# Patient Record
Sex: Female | Born: 1935 | Race: White | Hispanic: No | Marital: Married | Smoking: Current every day smoker
Health system: Southern US, Community
[De-identification: ages and names within clinical notes are randomized; demographics above are authoritative.]

## PROBLEM LIST (undated history)

## (undated) DIAGNOSIS — C329 Malignant neoplasm of larynx, unspecified: Secondary | ICD-10-CM

## (undated) DIAGNOSIS — S322XXA Fracture of coccyx, initial encounter for closed fracture: Secondary | ICD-10-CM

## (undated) DIAGNOSIS — E7800 Pure hypercholesterolemia, unspecified: Secondary | ICD-10-CM

## (undated) DIAGNOSIS — I700 Atherosclerosis of aorta: Secondary | ICD-10-CM

## (undated) DIAGNOSIS — I739 Peripheral vascular disease, unspecified: Secondary | ICD-10-CM

## (undated) DIAGNOSIS — I714 Abdominal aortic aneurysm, without rupture, unspecified: Secondary | ICD-10-CM

## (undated) DIAGNOSIS — J40 Bronchitis, not specified as acute or chronic: Secondary | ICD-10-CM

## (undated) DIAGNOSIS — J449 Chronic obstructive pulmonary disease, unspecified: Secondary | ICD-10-CM

## (undated) DIAGNOSIS — Z720 Tobacco use: Secondary | ICD-10-CM

## (undated) DIAGNOSIS — I10 Essential (primary) hypertension: Secondary | ICD-10-CM

## (undated) HISTORY — DX: Pure hypercholesterolemia, unspecified: E78.00

## (undated) HISTORY — DX: Abdominal aortic aneurysm, without rupture, unspecified: I71.40

## (undated) HISTORY — DX: Bronchitis, not specified as acute or chronic: J40

## (undated) HISTORY — DX: Essential (primary) hypertension: I10

## (undated) HISTORY — PX: BREAST SURGERY: SHX581

## (undated) HISTORY — DX: Atherosclerosis of aorta: I70.0

## (undated) HISTORY — DX: Chronic obstructive pulmonary disease, unspecified: J44.9

## (undated) HISTORY — PX: RENAL CRYOABLATION: SHX2322

## (undated) HISTORY — PX: NEPHRECTOMY: SHX65

## (undated) HISTORY — DX: Abdominal aortic aneurysm, without rupture: I71.4

## (undated) HISTORY — DX: Malignant neoplasm of larynx, unspecified: C32.9

## (undated) HISTORY — DX: Tobacco use: Z72.0

## (undated) HISTORY — DX: Peripheral vascular disease, unspecified: I73.9

## (undated) HISTORY — DX: Fracture of coccyx, initial encounter for closed fracture: S32.2XXA

## (undated) HISTORY — PX: COLONOSCOPY: SHX174

## (undated) HISTORY — PX: TONSILLECTOMY: SUR1361

---

## 1998-04-16 ENCOUNTER — Ambulatory Visit (HOSPITAL_COMMUNITY): Admission: RE | Admit: 1998-04-16 | Discharge: 1998-04-16 | Payer: Self-pay | Admitting: Urology

## 1998-07-22 ENCOUNTER — Other Ambulatory Visit: Admission: RE | Admit: 1998-07-22 | Discharge: 1998-07-22 | Payer: Self-pay | Admitting: Cardiology

## 1998-10-06 ENCOUNTER — Encounter: Payer: Self-pay | Admitting: Otolaryngology

## 1998-10-06 ENCOUNTER — Ambulatory Visit (HOSPITAL_COMMUNITY): Admission: RE | Admit: 1998-10-06 | Discharge: 1998-10-06 | Payer: Self-pay

## 1999-10-09 ENCOUNTER — Encounter: Payer: Self-pay | Admitting: Cardiology

## 1999-10-09 ENCOUNTER — Ambulatory Visit (HOSPITAL_COMMUNITY): Admission: RE | Admit: 1999-10-09 | Discharge: 1999-10-09 | Payer: Self-pay | Admitting: Cardiology

## 2000-08-31 ENCOUNTER — Other Ambulatory Visit: Admission: RE | Admit: 2000-08-31 | Discharge: 2000-08-31 | Payer: Self-pay | Admitting: Internal Medicine

## 2000-10-25 ENCOUNTER — Ambulatory Visit (HOSPITAL_COMMUNITY): Admission: RE | Admit: 2000-10-25 | Discharge: 2000-10-25 | Payer: Self-pay | Admitting: Otolaryngology

## 2000-10-25 ENCOUNTER — Encounter: Payer: Self-pay | Admitting: Otolaryngology

## 2001-03-02 ENCOUNTER — Encounter: Admission: RE | Admit: 2001-03-02 | Discharge: 2001-03-02 | Payer: Self-pay | Admitting: Urology

## 2001-03-02 ENCOUNTER — Encounter: Payer: Self-pay | Admitting: Urology

## 2001-10-27 ENCOUNTER — Encounter: Payer: Self-pay | Admitting: Otolaryngology

## 2001-10-27 ENCOUNTER — Ambulatory Visit (HOSPITAL_COMMUNITY): Admission: RE | Admit: 2001-10-27 | Discharge: 2001-10-27 | Payer: Self-pay | Admitting: Otolaryngology

## 2002-03-09 ENCOUNTER — Encounter: Admission: RE | Admit: 2002-03-09 | Discharge: 2002-03-09 | Payer: Self-pay | Admitting: Urology

## 2002-03-09 ENCOUNTER — Encounter: Payer: Self-pay | Admitting: Urology

## 2002-10-03 ENCOUNTER — Ambulatory Visit (HOSPITAL_COMMUNITY): Admission: RE | Admit: 2002-10-03 | Discharge: 2002-10-03 | Payer: Self-pay | Admitting: Gastroenterology

## 2003-04-02 ENCOUNTER — Encounter (HOSPITAL_BASED_OUTPATIENT_CLINIC_OR_DEPARTMENT_OTHER): Admission: RE | Admit: 2003-04-02 | Discharge: 2003-07-01 | Payer: Self-pay | Admitting: Internal Medicine

## 2003-06-28 ENCOUNTER — Ambulatory Visit (HOSPITAL_COMMUNITY): Admission: RE | Admit: 2003-06-28 | Discharge: 2003-06-28 | Payer: Self-pay | Admitting: Urology

## 2003-06-28 ENCOUNTER — Encounter: Payer: Self-pay | Admitting: Urology

## 2004-10-29 ENCOUNTER — Ambulatory Visit (HOSPITAL_COMMUNITY): Admission: RE | Admit: 2004-10-29 | Discharge: 2004-10-29 | Payer: Self-pay | Admitting: Internal Medicine

## 2005-03-19 ENCOUNTER — Inpatient Hospital Stay (HOSPITAL_COMMUNITY): Admission: EM | Admit: 2005-03-19 | Discharge: 2005-03-21 | Payer: Self-pay | Admitting: *Deleted

## 2005-04-19 ENCOUNTER — Ambulatory Visit (HOSPITAL_COMMUNITY): Admission: RE | Admit: 2005-04-19 | Discharge: 2005-04-19 | Payer: Self-pay | Admitting: Internal Medicine

## 2005-10-05 ENCOUNTER — Ambulatory Visit: Admission: RE | Admit: 2005-10-05 | Discharge: 2005-10-05 | Payer: Self-pay | Admitting: Urology

## 2005-11-17 ENCOUNTER — Ambulatory Visit (HOSPITAL_COMMUNITY): Admission: RE | Admit: 2005-11-17 | Discharge: 2005-11-17 | Payer: Self-pay | Admitting: Internal Medicine

## 2006-08-25 ENCOUNTER — Ambulatory Visit (HOSPITAL_COMMUNITY): Admission: RE | Admit: 2006-08-25 | Discharge: 2006-08-25 | Payer: Self-pay | Admitting: Urology

## 2006-11-23 ENCOUNTER — Ambulatory Visit (HOSPITAL_COMMUNITY): Admission: RE | Admit: 2006-11-23 | Discharge: 2006-11-23 | Payer: Self-pay | Admitting: Internal Medicine

## 2007-01-05 ENCOUNTER — Encounter: Admission: RE | Admit: 2007-01-05 | Discharge: 2007-01-05 | Payer: Self-pay | Admitting: Otolaryngology

## 2007-09-26 ENCOUNTER — Ambulatory Visit (HOSPITAL_COMMUNITY): Admission: RE | Admit: 2007-09-26 | Discharge: 2007-09-26 | Payer: Self-pay | Admitting: Urology

## 2007-12-14 HISTORY — PX: HIP FRACTURE SURGERY: SHX118

## 2007-12-19 ENCOUNTER — Encounter: Admission: RE | Admit: 2007-12-19 | Discharge: 2007-12-19 | Payer: Self-pay | Admitting: Otolaryngology

## 2008-04-07 ENCOUNTER — Inpatient Hospital Stay (HOSPITAL_COMMUNITY): Admission: EM | Admit: 2008-04-07 | Discharge: 2008-04-12 | Payer: Self-pay | Admitting: Emergency Medicine

## 2008-07-16 ENCOUNTER — Ambulatory Visit (HOSPITAL_COMMUNITY): Admission: RE | Admit: 2008-07-16 | Discharge: 2008-07-16 | Payer: Self-pay | Admitting: Internal Medicine

## 2008-09-25 ENCOUNTER — Ambulatory Visit (HOSPITAL_COMMUNITY): Admission: RE | Admit: 2008-09-25 | Discharge: 2008-09-25 | Payer: Self-pay | Admitting: Urology

## 2008-10-09 ENCOUNTER — Encounter: Admission: RE | Admit: 2008-10-09 | Discharge: 2008-10-09 | Payer: Self-pay | Admitting: Urology

## 2008-10-16 ENCOUNTER — Encounter: Admission: RE | Admit: 2008-10-16 | Discharge: 2008-10-16 | Payer: Self-pay | Admitting: Urology

## 2008-10-25 ENCOUNTER — Ambulatory Visit (HOSPITAL_COMMUNITY): Admission: RE | Admit: 2008-10-25 | Discharge: 2008-10-26 | Payer: Self-pay | Admitting: Interventional Radiology

## 2008-10-25 ENCOUNTER — Encounter (INDEPENDENT_AMBULATORY_CARE_PROVIDER_SITE_OTHER): Payer: Self-pay | Admitting: Interventional Radiology

## 2008-11-27 ENCOUNTER — Encounter: Admission: RE | Admit: 2008-11-27 | Discharge: 2008-11-27 | Payer: Self-pay | Admitting: Interventional Radiology

## 2009-01-21 ENCOUNTER — Encounter: Admission: RE | Admit: 2009-01-21 | Discharge: 2009-01-21 | Payer: Self-pay | Admitting: Internal Medicine

## 2009-03-11 IMAGING — RF DG HIP OPERATIVE*L*
1 series · 5 of 5 positions shown · non-contrast
Comparison: Plain films 04/07/2008

CLINICAL DATA: Hip fracture.  ORIF.

OPERATIVE LEFT HIP

[Series 1: run · 5 of 5 slices shown]
[im 1/5]
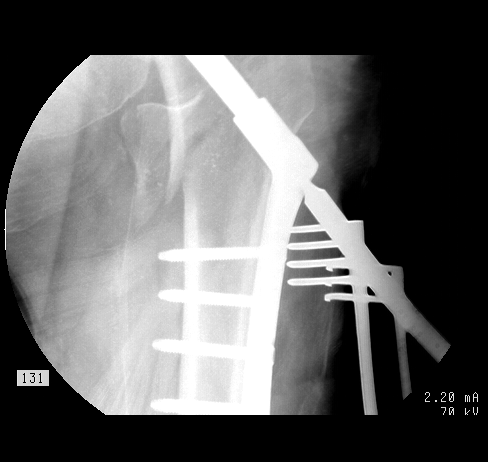
[im 2/5]
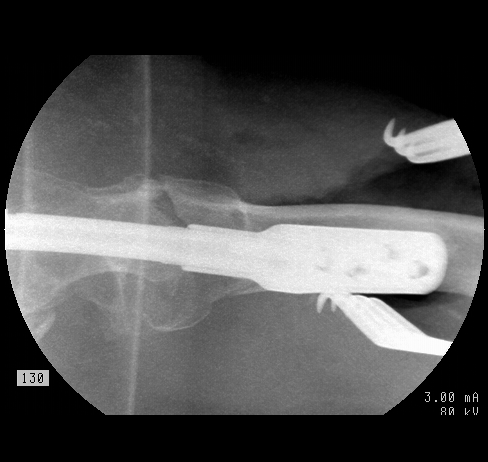
[im 3/5]
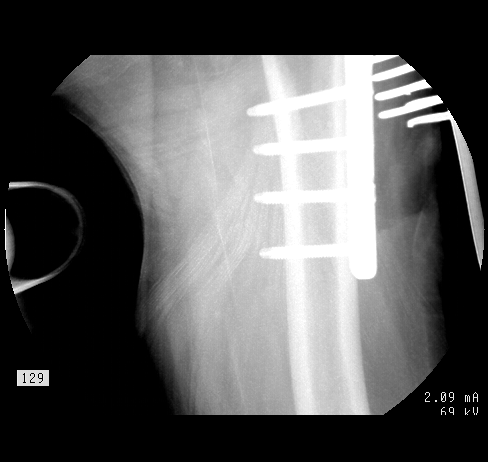
[im 4/5]
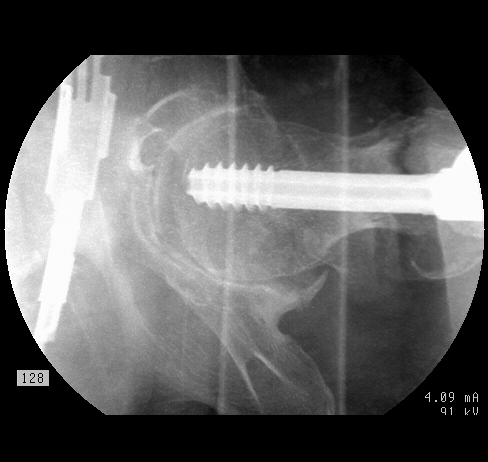
[im 5/5]
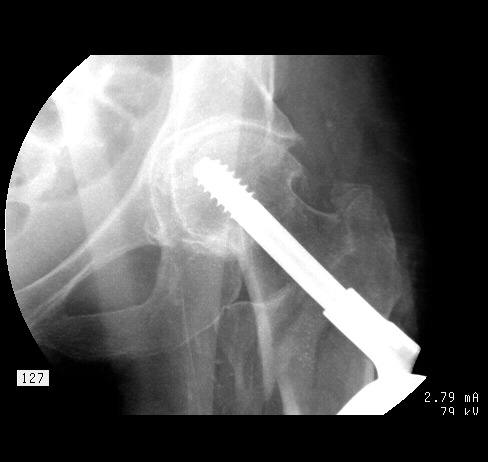

[5 of 5 positions shown; findings below may reference images not displayed]

FINDINGS: Five intraoperative fluoroscopic spot films demonstrate
placement of a left hip dynamic screw fixator.  Improved alignment.
IMPRESSION: Intraoperative spot films demonstrate placement of left hip dynamic
screw fixator.

## 2009-04-16 ENCOUNTER — Encounter: Admission: RE | Admit: 2009-04-16 | Discharge: 2009-04-16 | Payer: Self-pay | Admitting: Urology

## 2009-08-25 ENCOUNTER — Ambulatory Visit (HOSPITAL_COMMUNITY): Admission: RE | Admit: 2009-08-25 | Discharge: 2009-08-25 | Payer: Self-pay | Admitting: Internal Medicine

## 2009-09-29 ENCOUNTER — Ambulatory Visit: Payer: Self-pay | Admitting: Vascular Surgery

## 2009-10-15 ENCOUNTER — Encounter: Admission: RE | Admit: 2009-10-15 | Discharge: 2009-10-15 | Payer: Self-pay | Admitting: Interventional Radiology

## 2009-10-15 ENCOUNTER — Ambulatory Visit (HOSPITAL_COMMUNITY): Admission: RE | Admit: 2009-10-15 | Discharge: 2009-10-15 | Payer: Self-pay | Admitting: Interventional Radiology

## 2010-01-27 ENCOUNTER — Encounter: Admission: RE | Admit: 2010-01-27 | Discharge: 2010-01-27 | Payer: Self-pay | Admitting: Internal Medicine

## 2010-03-18 ENCOUNTER — Encounter: Admission: RE | Admit: 2010-03-18 | Discharge: 2010-03-18 | Payer: Self-pay | Admitting: Interventional Radiology

## 2010-03-18 ENCOUNTER — Ambulatory Visit (HOSPITAL_COMMUNITY): Admission: RE | Admit: 2010-03-18 | Discharge: 2010-03-18 | Payer: Self-pay | Admitting: Interventional Radiology

## 2010-05-08 ENCOUNTER — Ambulatory Visit (HOSPITAL_COMMUNITY): Admission: RE | Admit: 2010-05-08 | Discharge: 2010-05-08 | Payer: Self-pay | Admitting: Interventional Radiology

## 2010-06-09 ENCOUNTER — Encounter: Admission: RE | Admit: 2010-06-09 | Discharge: 2010-06-09 | Payer: Self-pay | Admitting: Interventional Radiology

## 2010-06-09 ENCOUNTER — Ambulatory Visit (HOSPITAL_COMMUNITY): Admission: RE | Admit: 2010-06-09 | Discharge: 2010-06-09 | Payer: Self-pay | Admitting: Interventional Radiology

## 2010-07-30 ENCOUNTER — Ambulatory Visit: Payer: Self-pay | Admitting: Cardiology

## 2010-08-31 ENCOUNTER — Encounter (HOSPITAL_BASED_OUTPATIENT_CLINIC_OR_DEPARTMENT_OTHER): Admission: RE | Admit: 2010-08-31 | Discharge: 2010-10-07 | Payer: Self-pay | Admitting: Internal Medicine

## 2011-01-04 ENCOUNTER — Other Ambulatory Visit: Payer: Self-pay | Admitting: Internal Medicine

## 2011-01-04 DIAGNOSIS — Z1239 Encounter for other screening for malignant neoplasm of breast: Secondary | ICD-10-CM

## 2011-01-11 ENCOUNTER — Inpatient Hospital Stay (HOSPITAL_COMMUNITY)
Admission: EM | Admit: 2011-01-11 | Discharge: 2011-01-13 | DRG: 552 | Disposition: A | Discharge status: Home / Self Care | Payer: MEDICARE | Attending: Internal Medicine | Admitting: Internal Medicine

## 2011-01-11 DIAGNOSIS — I70209 Unspecified atherosclerosis of native arteries of extremities, unspecified extremity: Secondary | ICD-10-CM | POA: Diagnosis present

## 2011-01-11 DIAGNOSIS — S81809A Unspecified open wound, unspecified lower leg, initial encounter: Secondary | ICD-10-CM | POA: Diagnosis present

## 2011-01-11 DIAGNOSIS — S61209A Unspecified open wound of unspecified finger without damage to nail, initial encounter: Secondary | ICD-10-CM | POA: Diagnosis present

## 2011-01-11 DIAGNOSIS — R296 Repeated falls: Secondary | ICD-10-CM | POA: Diagnosis present

## 2011-01-11 DIAGNOSIS — E785 Hyperlipidemia, unspecified: Secondary | ICD-10-CM | POA: Diagnosis present

## 2011-01-11 DIAGNOSIS — S20229A Contusion of unspecified back wall of thorax, initial encounter: Secondary | ICD-10-CM | POA: Diagnosis present

## 2011-01-11 DIAGNOSIS — H409 Unspecified glaucoma: Secondary | ICD-10-CM | POA: Diagnosis present

## 2011-01-11 DIAGNOSIS — J449 Chronic obstructive pulmonary disease, unspecified: Secondary | ICD-10-CM | POA: Diagnosis present

## 2011-01-11 DIAGNOSIS — I10 Essential (primary) hypertension: Secondary | ICD-10-CM | POA: Diagnosis present

## 2011-01-11 DIAGNOSIS — N390 Urinary tract infection, site not specified: Secondary | ICD-10-CM | POA: Diagnosis present

## 2011-01-11 DIAGNOSIS — M810 Age-related osteoporosis without current pathological fracture: Secondary | ICD-10-CM | POA: Diagnosis present

## 2011-01-11 DIAGNOSIS — Y9301 Activity, walking, marching and hiking: Secondary | ICD-10-CM

## 2011-01-11 DIAGNOSIS — J4489 Other specified chronic obstructive pulmonary disease: Secondary | ICD-10-CM | POA: Diagnosis present

## 2011-01-11 DIAGNOSIS — S3210XA Unspecified fracture of sacrum, initial encounter for closed fracture: Principal | ICD-10-CM | POA: Diagnosis present

## 2011-01-11 DIAGNOSIS — F17200 Nicotine dependence, unspecified, uncomplicated: Secondary | ICD-10-CM | POA: Diagnosis present

## 2011-01-11 LAB — URINALYSIS, ROUTINE W REFLEX MICROSCOPIC
Protein, ur: NEGATIVE mg/dL
Urobilinogen, UA: 0.2 mg/dL (ref 0.0–1.0)

## 2011-01-11 LAB — CBC
HCT: 41.6 % (ref 36.0–46.0)
Hemoglobin: 14.5 g/dL (ref 12.0–15.0)
MCH: 31.4 pg (ref 26.0–34.0)
MCHC: 34.9 g/dL (ref 30.0–36.0)

## 2011-01-11 LAB — COMPREHENSIVE METABOLIC PANEL
Alkaline Phosphatase: 63 U/L (ref 39–117)
BUN: 12 mg/dL (ref 6–23)
Chloride: 107 mEq/L (ref 96–112)
Glucose, Bld: 114 mg/dL — ABNORMAL HIGH (ref 70–99)
Potassium: 4.5 mEq/L (ref 3.5–5.1)
Total Bilirubin: 0.4 mg/dL (ref 0.3–1.2)

## 2011-01-11 LAB — DIFFERENTIAL
Eosinophils Relative: 0 % (ref 0–5)
Lymphs Abs: 1.1 10*3/uL (ref 0.7–4.0)
Monocytes Relative: 7 % (ref 3–12)
Neutro Abs: 11.8 10*3/uL — ABNORMAL HIGH (ref 1.7–7.7)

## 2011-01-11 LAB — URINE MICROSCOPIC-ADD ON

## 2011-01-12 LAB — PHOSPHORUS: Phosphorus: 3.4 mg/dL (ref 2.3–4.6)

## 2011-01-12 LAB — CBC
HCT: 37.2 % (ref 36.0–46.0)
MCH: 30.6 pg (ref 26.0–34.0)
MCV: 91.2 fL (ref 78.0–100.0)
Platelets: 232 10*3/uL (ref 150–400)
RBC: 4.08 MIL/uL (ref 3.87–5.11)

## 2011-01-12 LAB — VITAMIN D 25 HYDROXY (VIT D DEFICIENCY, FRACTURES): Vit D, 25-Hydroxy: 48 ng/mL (ref 30–89)

## 2011-01-12 LAB — DIFFERENTIAL
Eosinophils Absolute: 0.1 10*3/uL (ref 0.0–0.7)
Lymphocytes Relative: 16 % (ref 12–46)
Lymphs Abs: 1.4 10*3/uL (ref 0.7–4.0)
Monocytes Relative: 10 % (ref 3–12)
Neutrophils Relative %: 73 % (ref 43–77)

## 2011-01-12 LAB — COMPREHENSIVE METABOLIC PANEL
Alkaline Phosphatase: 56 U/L (ref 39–117)
BUN: 8 mg/dL (ref 6–23)
CO2: 26 mEq/L (ref 19–32)
Chloride: 105 mEq/L (ref 96–112)
Creatinine, Ser: 0.73 mg/dL (ref 0.4–1.2)
GFR calc non Af Amer: >60 mL/min (ref >60)
Glucose, Bld: 111 mg/dL — ABNORMAL HIGH (ref 70–99)
Total Bilirubin: 0.7 mg/dL (ref 0.3–1.2)

## 2011-01-12 LAB — MAGNESIUM: Magnesium: 2 mg/dL (ref 1.5–2.5)

## 2011-01-13 NOTE — H&P (Signed)
Victoria, Newman                ACCOUNT NO.:  [id number]  MEDICAL RECORD NO.:  [id number]          PATIENT TYPE:  OBV  LOCATION:  1334                         FACILITY:  [facility]  PHYSICIAN:  [name] A. [name], M.D.   DATE OF BIRTH:  [birth year]  DATE OF ADMISSION:  01/10/2011 DATE OF DISCHARGE:                             HISTORY & PHYSICAL   CHIEF COMPLAINT:  Fall with low back pain.  HISTORY OF PRESENT ILLNESS:  [name] is a pleasant 77 year old female with past medical history as listed below.  She was on her way to a basketball game when she was walking up the walkway towards the stadium, when her husband lost his balance.  They were holding hands and she fell down with him.  She had progressively worse a low back pain over the course of the evening and eventually presented to the [facility] emergency room where she was found to have a probable sacral fracture. She is admitted for further care for this.  She denies any syncope.  She denies any chest pain or shortness of breath.  She has movement of both extremities.  PAST MEDICAL HISTORY: 1. Left renal carcinoma, status post left nephrectomy and she had a     right kidney tumor and has had percutaneous ablation of this kidney     tumor on at least 1 occasion. 2. Osteoporosis. 3. Subclinical hyperthyroidism 4. COPD. 5. Atherosclerotic peripheral vascular disease. 6. Abdominal aortic aneurysm. 7. Hypertension. 8. Left hip pain. 9. G4, P3 parity status with normal deliveries x3 and one spontaneous     abortion 10.Irritable bowel syndrome. 11.Hypercholesterolemia. 12.Postmenopausal in her mid 68s. 13.Hypertension. 14.Tobacco abuse. 15.Glaucoma.  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 16.Diverticulosis. 17.Impaired fasting glucose. 18.In October 2010, she had 0.67 ankle brachial index bilaterally.     She has had breast implants.  She had a left hip fracture which has     a screw in place.  ALLERGIES:  MAXZIDE, BETA  BLOCKERS, SPIRIVA, and FOSAMAX.  CURRENT MEDICATIONS: 1. Advair HFA 45 - 21 two puffs p.o. b.i.d. 2. Albuterol neb 2.5 mg per 3 mL one neb q.6 h. p.r.n. 3. Alphagan and Lumigan eye drops per home dose less than 0.375 mg one-     half tablet p.o. q.6 h. p.r.n. irritable bowel symptoms. 4. Metoprolol XL 25 mg one-half tablet daily. 5. Norvasc 5 mg daily. 6. Reclast 5 mg IV.  Her last dose was August 25, 2010. 7. Zocor 40 mg daily. 8. Aspirin 81 mg daily. 9. Calcium with D twice daily. 10.Vitamin D 50,000 units once a week and Bactroban ointment to     affected areas as needed.  SOCIAL HISTORY:  [name] is married. Her husband's name is [name].  They were married in 1954.  She has 3 daughters, 2 granddaughters.  She is a Futures trader.  She has a long-term half pack a day smoking history of over 50 years and continues to smoke.  Rare social alcohol use.  FAMILY HISTORY:  Father died at age 10 of an MI.  Mother died at 68 of breast cancer that metastasized to  her spine.  She is an only child. Her children are in good health.  REVIEW OF SYSTEMS:  As per the history of present illness.  PHYSICAL EXAMINATION:  VITAL SIGNS:  Temperature 98.4, pulse 88, respiratory 16, blood pressure 136/75, 92% saturation on room air. Weight is 45.9 kg, height is 62 inches. GENERAL:  She is lying supine in bed, in no acute distress.  She is alert, oriented x4.  She moves extremities x4. NECK:  She has no JVD. LUNGS:  Clear to auscultation bilaterally anterolaterally. HEART:  Regular rate and rhythm with no significant murmur, rub, or gallop. ABDOMEN:  Soft, nontender, nondistended with no mass or hepatosplenomegaly.  There is no peripheral edema.  She does have a dressing to 2 digits of her right hand and a dressing below her left knee where she has a skin tear.  LABORATORY DATA:  Urine microscopic showed many squamous cells, too numerous to count white cells, 3 to 6 red blood cells.   Urinalysis dipstick showed moderate blood and large leukocytes, but was otherwise negative.  White count was 13,900, hemoglobin 14.5, platelet count 307,000 with 85% segs, 8% lymphocytes, 7% monocytes.  Sodium 138, potassium 4.5, chloride 107, CO2 22, BUN 12, creatinine 0.64, glucose 114.  GFR greater than 60.  Liver tests were all normal.  Sacrum and coccyx x-ray shows a cortical step-off seen along the anterior sacrum near the level of S3-S4 consistent with a fracture and lumbar spine x- ray shows lumbar spondylosis and scoliosis without acute osseous abnormality.  There is osteopenia.  There is an abdominal aortic aneurysm and right hip osteoarthritis.  The left hip x-ray shows no acute findings and does show the old left hip fracture and hip osteoarthritis but the compression screw is noted on the left side.  ASSESSMENT/PLAN:  An 77 year old female with osteoporosis and previous hip fracture, now with a sacral fracture.  We will admit her.  We will give her pain medications as needed.  We will get a physical therapy, occupational therapy to see her as well.  Will have clinical social work see year.  Will have a wound care nurse consult for her skin tears. We will consider an MRI of her lumbosacral spine to clear if symptoms do not improve.  We will make sure urine culture has been sent given her pyuria.  However, she denies urine infection symptoms currently.  She may need some form of rehab stay prior to going home. We will continue her other home medications for her chronic obstructive pulmonary disease and her glaucoma and her hypertension.  She is a full code status.  We will place her on Lovenox for DVT prophylaxis.     [name] A. [name], M.D.     MAP/MEDQ  D:  01/11/2011  T:  01/11/2011  Job:  [id number]  Electronically Signed by [name] M.D. on 01/13/2011 09:39:53 AM

## 2011-01-14 LAB — UIFE/LIGHT CHAINS/TP QN, 24-HR UR
Beta, Urine: DETECTED — AB
Free Lambda Lt Chains,Ur: 0.24 mg/dL (ref 0.08–1.01)
Gamma Globulin, Urine: DETECTED — AB
Total Protein, Urine: 8.8 mg/dL

## 2011-01-15 LAB — PROTEIN ELECTROPH W RFLX QUANT IMMUNOGLOBULINS
Albumin ELP: 56.4 % (ref 55.8–66.1)
Alpha-1-Globulin: 7.1 % — ABNORMAL HIGH (ref 2.9–4.9)
Alpha-2-Globulin: 13.1 % — ABNORMAL HIGH (ref 7.1–11.8)
Beta 2: 4.1 % (ref 3.2–6.5)
Beta Globulin: 6 % (ref 4.7–7.2)

## 2011-01-21 NOTE — Discharge Summary (Signed)
[name], [name]                ACCOUNT NO.:  [id number]  MEDICAL RECORD NO.:  [id number]          PATIENT TYPE:  INP  LOCATION:  1334                         FACILITY:  [facility]  PHYSICIAN:  Janiene Aarons A. Azusena Erlandson, M.D.   DATE OF BIRTH:  [birth year]  DATE OF ADMISSION:  01/10/2011 DATE OF DISCHARGE:  01/13/2011                              DISCHARGE SUMMARY   DISCHARGE DIAGNOSES: 1. Status post fall with contusion to sacrum.  There is a possibility     that there is a fracture there as there was possibly a fracture     seen on the plane x-ray; however, this was not confirmed by MRI and     I did not desire to do a CT scan of the pelvis at this time. 2. Osteoporosis. 3. Chronic obstructive pulmonary disease. 4. Atherosclerotic peripheral vascular disease. 5. Skin tears to the left leg and the right fourth and fifth digits of     the right hand with wound care ongoing. 6. Hypertension. 7. Hyperlipidemia. 8. Tobacco abuse. 9. Chronic obstructive pulmonary disease. 10.Subclinical hyperthyroidism 11.Glaucoma. 12.She did have a urinary tract infection suspected as well, culture     still pending at the time of discharge.  PROCEDURES:  Physical therapy and occupational therapy and wound care nurse consultations.  MRI of the spine on January 12, 2011 which shows a discontinued exam due to the patient's pain level.  She had no definite sacral fracture or edema, but there was some presacral soft tissue edema in the deep pelvis along the left sacral ala which is nonspecific. There was multilevel disk and facet degeneration which resulted in mild spinal stenosis at L3-L4 and foraminal stenosis at L2-L3 and L4 nerve levels as noted above.  DISCHARGE MEDICATIONS: 1. Tylenol 650 mg every 6 hours as needed but do not mix in the same 6-     hour period as Vicodin. 2. Albuterol 2.5 mg and a 3 mL neb one neb every 6 hours as needed for     wheezing, coughing, and shortness of breath. 3. Align  probiotic 1 tablet daily for 2 weeks. 4. Ciprofloxacin 250 mg twice daily for 3 further days. 5. Colace 100 mg twice daily. 6. Vitamin D 50,000 units 1 each week. 7. Hydrocodone/APAP 5/325 one half to two pills every 6 hours as     needed for pain. 8. MiraLax 17 grams in 8 ounces of water daily as needed for     constipation. 9. Reclast IV 5 mg infused over 1 hour every year, next dose is due in     late September 2012. 10.Advair 250/50 one puff twice daily.  Rinse mouth with water after     use.  She often does not use this.  She is encouraged to at least     the one dose a day. 11.Alphagan eyedrops 0.15% one drop in left eye twice daily. 12.Amlodipine 5 mg daily. 13.Aspirin 81 mg daily. 14.Azopt 1% eyedrop 1 drop in left eye twice daily. 15.Lumigan 0.3% eye drop 1 drop in left eye daily at bedtime. 16.Metoprolol XL 25 mg one-half pill each morning. 17.Simvastatin  40 mg one-half pill daily at bedtime.  She may continue     her vitamin C and D daily supplement as well.  HISTORY OF PRESENT ILLNESS:  [name] is a pleasant 77 year old female with osteoporosis who suffered a fall while walking to a basketball stadium. She had significant pelvic pain and was unable to ambulate.  She eventually presented to the [facility] Emergency Room and plain x-ray showed a sacral fracture.  She was admitted for further care.  HOSPITAL COURSE:  [name] was admitted to a regular bed.  She remained stable from a cardiovascular and pulmonary standpoints.  Her oxygen saturations however did remain in the 90% to 92% range on room air due to her COPD.  She was felt to have urinary tract infection and was treated with Cipro but a culture may not have been sent from the emergency room and there is still no culture result back at the time of discharge.  She progressed to physical therapy.  She required morphine intravenously and this was transitioned to oral Vicodin for pain.  We did perform an MRI of the sacrum  and low back with results as noted above.  Fortunately, she progressed to physical therapy and on January 13, 2011, she was deemed stable for discharge home as she does have a supportive family that can provide basically 24-hour assistance for the next few weeks as she improves.  Wound care nurse was consulted and helped with the dressing orders for her skin tears to her leg and her right hand fingers.  She was a full code status during the stay.  DISCHARGE PHYSICAL EXAM:  VITAL SIGNS:  Temperature 97.8, afebrile, pulse 87, respiratory rate 20, blood pressure 146/85 and 149/81 on the 2 checks prior to discharge, 91% to 92% oxygen saturation on room air. GENERAL:  She was sitting in the chair in no acute distress.  She is alert and oriented x4. LUNGS:  Revealed some decreased breath sounds bilaterally and a couple scattered rhonchi, but otherwise no wheezes or rales.  HEART:  Regular rate and rhythm with no murmur, rub, or gallop. ABDOMEN:  Soft, nontender, nondistended with no mass or hepatosplenomegaly. EXTREMITIES:  There was no peripheral edema. SKIN:  She has chronic venous stasis changes of the leg and she has poor collagen integrity of the skin throughout her body.  DISCHARGE LABORATORY DATA:  Her vitamin D level was 48 ng/mL, TSH was 0.384.  She does have a history of subclinical hypothyroidism but this test is stable currently.  Phosphorus and magnesium levels were normal on January 12, 2011.  Sodium was 139, potassium 4.3, chloride 105, CO2 26, BUN 8, creatinine 0.73, glucose 111, GFR greater than 60.  Liver tests were normal with the exception of an albumin of 3.0, white count 8.9, hemoglobin 12.5, platelet count 232,000.  There was a normal differential.  DISCHARGE INSTRUCTIONS:  [name] is to be up with her walker and increase her activity slowly and she is to work with physical therapy and occupational therapy.  She is to follow a low-sodium, heart-healthy diet.  She is to  call for visit with Dr. Waynard Edwards in 2 weeks. Her wound care order is as follows.  Left leg wound and right fourth and fifth finger wounds should be cleaned with normal saline then padded dry, then Mepitel should be applied to the open areas, this should be topped with dry gauze and secured with Kerlix and tape and changed every 5 days until it is healed.  Forty-five  minutes were spent in the discharge process on the unit with the patient.     Janene Yousuf A. Waynard Edwards, M.D.     MAP/MEDQ  D:  01/13/2011  T:  01/13/2011  Job:  [id number]  Electronically Signed by Rodrigo Ran M.D. on 01/21/2011 06:53:16 PM

## 2011-02-01 ENCOUNTER — Ambulatory Visit: Payer: Self-pay | Admitting: Cardiology

## 2011-02-15 ENCOUNTER — Ambulatory Visit
Admission: RE | Admit: 2011-02-15 | Discharge: 2011-02-15 | Disposition: A | Discharge status: Home / Self Care | Payer: MEDICARE | Source: Ambulatory Visit | Attending: Internal Medicine | Admitting: Internal Medicine

## 2011-02-15 DIAGNOSIS — Z1239 Encounter for other screening for malignant neoplasm of breast: Secondary | ICD-10-CM

## 2011-03-01 LAB — BASIC METABOLIC PANEL
CO2: 29 mEq/L (ref 19–32)
Calcium: 9 mg/dL (ref 8.4–10.5)
Chloride: 104 mEq/L (ref 96–112)
GFR calc Af Amer: >60 mL/min (ref >60)
Potassium: 4.2 mEq/L (ref 3.5–5.1)
Sodium: 141 mEq/L (ref 135–145)

## 2011-03-01 LAB — CROSSMATCH: Antibody Screen: NEGATIVE

## 2011-03-01 LAB — PROTIME-INR: INR: 0.97 (ref 0.00–1.49)

## 2011-03-01 LAB — CBC
Hemoglobin: 14.7 g/dL (ref 12.0–15.0)
MCHC: 33.2 g/dL (ref 30.0–36.0)
MCV: 95.1 fL (ref 78.0–100.0)
RBC: 4.67 MIL/uL (ref 3.87–5.11)

## 2011-03-03 ENCOUNTER — Encounter: Payer: Self-pay | Admitting: Cardiology

## 2011-03-03 DIAGNOSIS — H409 Unspecified glaucoma: Secondary | ICD-10-CM | POA: Insufficient documentation

## 2011-03-03 DIAGNOSIS — E7800 Pure hypercholesterolemia, unspecified: Secondary | ICD-10-CM | POA: Insufficient documentation

## 2011-03-03 DIAGNOSIS — J449 Chronic obstructive pulmonary disease, unspecified: Secondary | ICD-10-CM | POA: Insufficient documentation

## 2011-03-03 DIAGNOSIS — I739 Peripheral vascular disease, unspecified: Secondary | ICD-10-CM | POA: Insufficient documentation

## 2011-03-03 DIAGNOSIS — I700 Atherosclerosis of aorta: Secondary | ICD-10-CM | POA: Insufficient documentation

## 2011-03-03 DIAGNOSIS — I10 Essential (primary) hypertension: Secondary | ICD-10-CM | POA: Insufficient documentation

## 2011-03-08 ENCOUNTER — Encounter (INDEPENDENT_AMBULATORY_CARE_PROVIDER_SITE_OTHER): Payer: MEDICARE | Admitting: Cardiology

## 2011-03-08 ENCOUNTER — Encounter: Payer: Self-pay | Admitting: Cardiology

## 2011-03-08 DIAGNOSIS — R0789 Other chest pain: Secondary | ICD-10-CM

## 2011-03-08 DIAGNOSIS — I10 Essential (primary) hypertension: Secondary | ICD-10-CM

## 2011-03-08 MED ORDER — METOPROLOL TARTRATE 25 MG PO TABS: ORAL_TABLET | ORAL | Status: DC

## 2011-03-09 NOTE — Progress Notes (Signed)
This encounter was created in error - please disregard.

## 2011-04-05 ENCOUNTER — Ambulatory Visit (INDEPENDENT_AMBULATORY_CARE_PROVIDER_SITE_OTHER): Payer: MEDICARE | Admitting: Cardiology

## 2011-04-05 ENCOUNTER — Encounter: Payer: Self-pay | Admitting: Cardiology

## 2011-04-05 DIAGNOSIS — I700 Atherosclerosis of aorta: Secondary | ICD-10-CM

## 2011-04-05 DIAGNOSIS — I10 Essential (primary) hypertension: Secondary | ICD-10-CM

## 2011-04-05 DIAGNOSIS — I739 Peripheral vascular disease, unspecified: Secondary | ICD-10-CM

## 2011-04-05 NOTE — Assessment & Plan Note (Signed)
Well controlled on current therapy 

## 2011-04-05 NOTE — Assessment & Plan Note (Signed)
No significant claudication symptoms. We will continue with risk factor modification and aspirin therapy.

## 2011-04-05 NOTE — Assessment & Plan Note (Signed)
No signs or symptoms of dissection. We'll continue with preventative therapy with a pressure control, statin therapy, and aspirin.

## 2011-04-05 NOTE — Patient Instructions (Signed)
Continue current medications.  Try and quit smoking.  Dr. Waynard Edwards will check your cholesterol in August.  We will see you for an office visit in 6 months.

## 2011-04-05 NOTE — Progress Notes (Signed)
[name] Date of Birth: February 26, 1928   History of Present Illness: [name] is seen for followup today. She reports that she has had a recent bout of bronchitis. She was treated with antibiotics and inhaler therapy. She has had one episode where her heart was racing. She awoke from sleep and felt palpitations. She thought she may have had a bad dream and she just went back to sleep. This is the only episode of palpitations that she has had. She does complain of chronically cold feet. She denies any significant claudication symptoms.  Current Outpatient Prescriptions on File Prior to Visit  Medication Sig Dispense Refill  . amLODipine (NORVASC) 5 MG tablet Take 5 mg by mouth daily.       Marland Kitchen aspirin 81 MG tablet Take 81 mg by mouth daily.        . bimatoprost (LUMIGAN) 0.03 % ophthalmic drops 1 drop at bedtime.        . brimonidine (ALPHAGAN) 0.15 % ophthalmic solution 1 drop daily.        . brinzolamide (AZOPT) 1 % ophthalmic suspension 1 drop daily.        . calcium carbonate (OS-CAL) 600 MG TABS Take 600 mg by mouth daily.        . ergocalciferol (VITAMIN D2) 50000 UNITS capsule Take 50,000 Units by mouth once a week.        . Fluticasone-Salmeterol (ADVAIR HFA IN) Inhale into the lungs as needed.        . metoprolol succinate (TOPROL-XL) 25 MG 24 hr tablet Take 12.5 mg by mouth daily.       . simvastatin (ZOCOR) 40 MG tablet Take 40 mg by mouth at bedtime.       . sodium chloride 0.9 % SOLN 1,000 mL with calcium chloride 10 % SOLN 8 g Inject 8 g into the vein as needed. YEARLY (SEPTEMBER)       . metoprolol tartrate (LOPRESSOR) 25 MG tablet Take 1/2 tablet daily  90 tablet  3    No Known Allergies  Past Medical History  Diagnosis Date  . Aortic atherosclerosis   . PAD (peripheral artery disease)   . COPD (chronic obstructive pulmonary disease)   . HTN (hypertension)   . Hypercholesteremia   . Glaucoma   . Bronchitis   . Fractured coccyx     Past Surgical History    Procedure Date  . Nephrectomy     LEFT/CANCER  . Renal cryoablation     Right with radiation    History  Smoking status  . Current Everyday Smoker -- 0.5 packs/day for 65 years  . Types: Cigarettes  Smokeless tobacco  . Not on file    History  Alcohol Use No    Family History  Problem Relation Age of Onset  . Heart attack Father   . Hypertension Father   . Cancer Mother     MOUTH,BREAST,SPINE    Review of Systems: The review of systems is positive for recent cough and bronchitis.  The symptoms are improving.All other systems were reviewed and are negative.  Physical Exam: BP 118/80  Pulse 80  Wt 92 lb (41.731 kg) She is a thin elderly white female who is chronically ill-appearing. HEENT exam is unremarkable. She has no JVD or bruits. Lungs are clear. Cardiac exam is without gallop, murmur, or click. Abdomen is soft and nontender. She has no lower extremity edema. She has very poor pedal pulses. Her skin is thin and atrophic.  She is alert and oriented x3. Cranial nerves II through XII are intact. LABORATORY DATA:   Assessment / Plan:

## 2011-04-27 ENCOUNTER — Other Ambulatory Visit: Payer: Self-pay | Admitting: Interventional Radiology

## 2011-04-27 ENCOUNTER — Other Ambulatory Visit (HOSPITAL_COMMUNITY): Payer: Self-pay | Admitting: Interventional Radiology

## 2011-04-27 NOTE — Discharge Summary (Signed)
NAMEAMANDALYNN, PITZ                ACCOUNT NO.:  [id number]   MEDICAL RECORD NO.:  [id number]          PATIENT TYPE:  INP   LOCATION:  1605                         FACILITY:  [facility]   PHYSICIAN:  Almedia Balls. Ranell Patrick, M.D. DATE OF BIRTH:  1927-12-22   DATE OF ADMISSION:  04/07/2008  DATE OF DISCHARGE:  04/09/2008                               DISCHARGE SUMMARY   ADMISSION DIAGNOSIS:  Left femur fracture status post fall.   DISCHARGE DIAGNOSIS:  Left femur fracture status post open reduction  internal fixation.   BRIEF HISTORY:  The patient is a 77 year old female who was sustained a  ground-level fall injuring her left hip.  The patient presented to the  emergency department was admitted for surgical management.   PROCEDURE:  The patient had a left femur ORIF using DePuy TK2 system by  Dr. Malon Kindle on April 07, 2008.  Assistant was [employer].  General anesthesia was used.  Estimated blood loss was 450 mL.  No  complications.   HOSPITAL COURSE:  The patient was admitted on April 07, 2008 for the  above-stated procedure which she tolerated well.  After adequate time in  the post anesthesia care unit she was transferred to [facility].  Postop  day #1 the patient complained of moderate pain to the left hip but  neurovascularly intact.  The dressing was clean and intact.  Neurovascularly she was otherwise was doing well.  Postop day 2 and 3  the patient continued to improve gently but it was decided that short  term skilled nursing would be the best fit for Ms. Morella which she was  in agreement with.  The plan for skilled nursing was found.  We did  discharge her there.   DISCHARGE/PLAN:  The patient will be discharged to skilled nursing  facility on April 10, 2008.   FOLLOW-UP:  The patient will follow back up with Dr. Malon Kindle in  two weeks.   CONDITION:  Her condition is stable.   DIET:  Her diet is regular.   ACTIVITY:  Weightbearing as tolerated with a  rolling walker.   ALLERGIES:  The patient has an allergy to [facility].   DISCHARGE MEDICATIONS:  1. Norvasc 5 mg p.o. q. Daily.  2. Lumigan one  drop each eye daily.  3. Alphagan one drop each eye b.i.d.  4. Azopt one drop each eye b.i.d.  5. Doxycycline 100 mg p.o. b.i.d.  6. Advair one puff inhaled b.i.d.  7. Lopressor 12.5 mg p.o. daily.  8. Zocor 40 mg p.o. nightly.  9. Coumadin per pharmacy protocol.  10.Vicodin 5/325 one or two tabs q. 4-6 hours p.r.n. pain.      Thomas B. Durwin Nora, P.A.      Almedia Balls. Ranell Patrick, M.D.  Electronically Signed    TBD/MEDQ  D:  04/09/2008  T:  04/09/2008  Job:  [id number]

## 2011-04-27 NOTE — Op Note (Signed)
[name], [name]                ACCOUNT NO.:  [id number]   MEDICAL RECORD NO.:  [id number]          PATIENT TYPE:  INP   LOCATION:  0098                         FACILITY:  [facility]   PHYSICIAN:  Almedia Balls. Ranell Patrick, M.D. DATE OF BIRTH:  24-Nov-1928   DATE OF PROCEDURE:  04/07/2008  DATE OF DISCHARGE:                               OPERATIVE REPORT   PREOPERATIVE DIAGNOSIS:  Left displaced intertrochanteric hip fracture.   POSTOPERATIVE DIAGNOSIS:  Left displaced intertrochanteric hip fracture.   PROCEDURE:  Open reduction and internal fixation of left displaced  intertrochanteric hip fracture using Dupuy TK2 system.   SURGEON:  Almedia Balls. Ranell Patrick, M.D.   ASSISTANT:  Donnie Coffin. Durwin Nora, P.A.   ANESTHESIA:  General anesthesia.   ESTIMATED BLOOD LOSS:  200 mL.   FLUIDS REPLACED:  1200 mL of crystalloid.   URINE OUTPUT:  450 mL.   COUNTS:  Needle, sponge, and instrument counts correct.   COMPLICATIONS:  None.  Perioperative antibiotics were given.   INDICATIONS FOR PROCEDURE:  The patient is a 77 year old female who  suffered a ground level fall.  The patient's dog ran under her legs and  she fell landing directly onto her head, striking her head against the  counter and landing also on her left side.  The patient complained of  immediate left hip pain, was unable to ambulate and presented to [facility] emergency department where x-rays demonstrated a  displaced intertrochanteric hip fracture.  CT of the head and neck were  negative other than for arthritis.  The patient presents now for  operative treatment to stabilize her left hip.   DESCRIPTION OF PROCEDURE:  After an adequate level of anesthesia was  achieved, the patient was positioned up on the fracture table.  The left  foot was placed in the traction boot and well padded.  All neurovascular  structures were padded appropriate.  Using traction and manipulation, we  were able to nearly anatomically reduce  the intertrochanteric hip  fracture.  We then sterilely prepped and draped the left hip and went in  entered through a lateral incision starting at the greater trochanter  and extending distally.  Dissection down through the subcutaneous  tissues.  Tensor fascia lata divided with the Bovie.  We then lifted up  the vastus lateralis and divided the fascia posteriorly.  We were  careful to divide as little vastus muscle as possible and basically  dissect the muscle off the posterior fascia and lift that up over the  anterior femur using Bennett retractors anteriorly.  We then placed the  guide pin in the center of the femur on the EP view centered a little  bit low and on the lateral view, we were centered on the head.  With  this guide pin in place, we measured it to a depth of 90 mm.  We went  ahead and used the step cut drill to drill the hole and then tapped the  hole for the lag screw for the TK2 Dupuy system.  We again used a 90 mm  screw which was  keyed and a four-hole 135 plate.  This was slid down and  the screw placed with decent purchase into the bone which was  osteoporotic.  We then went ahead and impacted the side plate and  connected the side plate with four 4.5 bicortical screws.  We had good  purchase with the screws into the bone of the femur and then placed a  compression screw and took off the tension on the traction and then  compressed the fracture site.  There was a little bit of translation  noted at the fracture site as we compressed.  We then went ahead and  thoroughly irrigated and closed the layered closure.  Vastus fascia run  with #1 Vicryl suture followed by interrupted figure-of-eight #1 suture  for the tensor fascia lata.  Layered subcutaneous closure with 2-0  Vicryl suture followed by a running 4-0 Monocryl for skin.  Steri-Strips  and sterile dressing were applied.  The patient tolerated the surgery  well.      Almedia Balls. Ranell Patrick, M.D.  Electronically  Signed     SRN/MEDQ  D:  04/07/2008  T:  04/08/2008  Job:  [id number]

## 2011-04-30 NOTE — Consult Note (Signed)
NAME:  [name], [name]                          ACCOUNT NO.:  [id number]   MEDICAL RECORD NO.:  [id number]                   PATIENT TYPE:  REC   LOCATION:  FOOT                                 FACILITY:  [facility]   PHYSICIAN:  Jonelle Sports. Sevier, M.D.              DATE OF BIRTH:  25-Mar-1928   DATE OF CONSULTATION:  04/02/2003  DATE OF DISCHARGE:                                   CONSULTATION   REFERRING PHYSICIAN:  Mark A. Perini, M.D.   REASON FOR CONSULTATION:  This 77 year old white female is referred at the  courtesy of Loraine Leriche A. Perini, M.D., for assistance with management of a  chronic traumatic wound of the left lower extremity.   HISTORY OF PRESENT ILLNESS:  The patient does not have diabetes, but has a  history of smoking for years and evidence of peripheral vascular disease.  She has had no previous foot or lower extremity problems of any consequence.  She does have symptoms suggestive of claudication.  She struck the anterior  ankle crease area of her left foot just anterior to the medial malleolus on  an open low drawer in early February and sustained a wound there which has  progressed into a punched out ulcer and has failed to heal despite the  application of topical creams and foam dressings.  At one point, she saw  Thereasa Distance A. Mortenson, M.D., and he debrided the area somewhat with great pain  to her, but had no other specific recommendations.   She is scheduled to have arterial Doppler examinations of her lower  extremities done tomorrow.   She is referred her by Dr. Waynard Edwards for our assistance with her management.   PAST MEDICAL HISTORY:  Noteworthy for:  1. Hypertension.  2. Tobacco abuse.  3. Chronic bronchitis.  4. Elevated cholesterol.  5. Glaucoma.   ALLERGIES:  She has no known medicinal allergies.   MEDICATIONS:  Her regular medications include:  1. Toprol XL.  2. Zocor.  3. Baby aspirin.  4. Norvasc.  5. Several eyedrops.   PHYSICAL EXAMINATION:   EXTREMITIES:  The examination today is limited to the  distal lower extremities.  Both feet are cool and shiny, suggestive of  neurovascular abnormality.  Skin temperatures are diminished, but are  reasonably symmetrical.  There is no edema.  Monofilament testing shows a  protective sensation throughout both feet.  Pulses are not palpable and are  found to be monophasic by Doppler determination.  There is some plantar  callous of the heels, but nothing major.  On the anterior aspect of the  right ankle crease just anterior to the medial malleolus is a shallow  punched out ulcer measuring 13 x 11 mm with yellow slough in its base having  no significant surrounding erythema.   ASSESSMENT AND PLAN:  1. Consideration conversation is held with the patient, both physicians, and     nurse to  point out the importance of her abandoning smoking.  She is also     in the habit of going barefoot frequently and this is discouraged.  It is     recommended that she get supportive shoes that do not involve thongs     between the toes.  2. The wound is cleansed, not debrided because of the potential for pain to     her, and treated with Panafil ointment covered by an Allevyn pad.  3. The patient is instructed to change this dressing daily, gently washing     the area with warm mild soapy water, rinsing well, patting dry, then air     drying, and then reapplying the Panafil and Allevyn pad.  4. The patient is encouraged to report for her previously scheduled Doppler     examinations tomorrow and to request that the report be sent here.  5. It is anticipated that once we get this ulcer clean, we can perhaps go     with either ______ or Lasix to see if we can stimulate wound healing.  6. A follow-up visit here will be in six days.                                               Jonelle Sports. Cheryll Cockayne, M.D.    RES/MEDQ  D:  04/04/2003  T:  04/05/2003  Job:  [id number]   cc:   Loraine Leriche A. Waynard Edwards, M.D.  [address]  [place]  [place] [address]  Fax: [phone]

## 2011-04-30 NOTE — Op Note (Signed)
   NAME:  [name], [name]                          ACCOUNT NO.:  [id number]   MEDICAL RECORD NO.:  [id number]                   PATIENT TYPE:  AMB   LOCATION:  ENDO                                 FACILITY:  [facility]   PHYSICIAN:  John C. Madilyn Fireman, M.D.                 DATE OF BIRTH:  August 26, 1928   DATE OF PROCEDURE:  DATE OF DISCHARGE:                                 OPERATIVE REPORT   PROCEDURE:  Colonoscopy.   INDICATIONS FOR PROCEDURE:  Screening colonoscopy in a 77 year old patient  with no prior colon screening.   DESCRIPTION OF PROCEDURE:  The patient was placed in the left lateral  decubitus position then placed on the pulse monitor with continuous low flow  oxygen delivered by nasal cannula. She was sedated with 60 mg IV Demerol and  8 mg IV Versed. The Olympus video colonoscope was inserted into the rectum  and advanced to the cecum, confirmed by transillumination at McBurney's  point and visualization at the ileocecal valve and appendiceal orifice. The  prep was excellent. The cecum, ascending, and transverse colon  appeared  normal with no masses, polyps, diverticula or other mucosal abnormalities.  Within the descending and sigmoid colon, there were seen several diverticula  but no other abnormalities. The rectum appeared normal and retroflexed view  of the anus revealed no obvious internal hemorrhoids. The colonoscope was  then withdrawn and the patient returned to the recovery room in stable  condition. The patient tolerated the procedure well and there were no  immediate complications.   IMPRESSION:  Left sided diverticulosis, otherwise, normal colonoscopy.   PLAN:  Flexible sigmoidoscopy in five years for next colon screening.                                               John C. Madilyn Fireman, M.D.    JCH/MEDQ  D:  10/03/2002  T:  10/03/2002  Job:  [id number]   cc:   Loraine Leriche A. Perini, M.D.

## 2011-04-30 NOTE — H&P (Signed)
[name], [name]                ACCOUNT NO.:  [id number]   MEDICAL RECORD NO.:  [id number]          PATIENT TYPE:  INP   LOCATION:  0352                         FACILITY:  [facility]   PHYSICIAN:  Kari Baars, M.D.  DATE OF BIRTH:  10/03/1928   DATE OF ADMISSION:  03/19/2005  DATE OF DISCHARGE:                                HISTORY & PHYSICAL   PRIMARY CARE PHYSICIAN:  Mark A. Perini, M.D.   CHIEF COMPLAINT:  Shortness of breath.   HISTORY OF PRESENT ILLNESS:  [name] is a 77 year old white female,  patient of Dr. Waynard Edwards, with a history of COPD, history of renal cancer  status post left nephrectomy (1994) who presented to the emergency  department with a complaint of shortness of breath.  The patient states that  she was in her usual state of health until yesterday when she developed  slight worsening shortness of breath.  This worsened overnight, when she was  awoken suddenly at about 2 a.m. with shortness of breath and wheezing.  She  denied any chest pain, diaphoresis, nausea, vomiting, fever, chills, or  sweats.  She used her nebulizers but felt minimal improvement and did feel  like her heart was racing.  She presented to the emergency department where  she has received nebulizer treatments and a dose of prednisone with some  improvement.  However, her oxygen saturations remain low and she is still  having some shortness of breath and dyspnea on exertion.  She does endorse  cough which is nonproductive.  She has had COPD flares in the past, most  recently requiring prednisone about one month ago.   PAST MEDICAL HISTORY:  1.  History of renal cell cancer, status post left nephrectomy (1994).  2.  Irritable bowel syndrome.  3.  Hyperlipidemia.  4.  Osteoporosis.  5.  Hypertension.  6.  Diverticulosis.  7.  Peripheral vascular disease.  8.  COPD.   MEDICATIONS:  1.  Hyoscyamine 0.35 one half tablet daily.  2.  Cosopt eye drops b.i.d.  3.  Toprol XL 100 mg daily.  This was recently increased.  4.  Zocor 40 mg one half tablet daily.  5.  Aspirin 81 mg daily.  6.  Lumigan eye drops daily.  7.  Norvasc 2.5 mg daily.  8.  Advair 250/50 b.i.d.  She has been taking this irregularly.  9.  Tessalon Perles p.r.n.  10. Mucinex p.r.n.   ALLERGIES:  MAXZIDE.   FAMILY HISTORY:  Her father had an MI.  Her mother had breast cancer.   SOCIAL HISTORY:  She continues to smoke a half pack per day.  She is a  [employer] trader.  She has social alcohol.   REVIEW OF SYSTEMS:  All systems were reviewed and are negative except as in  the HPI with the following exceptions:  Palpitations with nebulizer.   PHYSICAL EXAMINATION:  VITAL SIGNS:  Temperature 98.2, blood pressure  125/60, respiratory rate 20, heart rate 93.  GENERAL:  Alert and oriented x 3.  No acute distress.  Able to talk in full  sentences.  HEENT:  Oropharynx is moist without erythema.  Pupils are equal, round, and  reactive to light.  NECK:  Supple without lymphadenopathy, JVD, or carotid bruits.  HEART:  Regular rate and rhythm without murmurs, rubs, or gallops.  LUNGS:  She has distant breath sounds with minimal bilateral wheezes.  ABDOMEN:  Soft, nondistended, nontender with normoactive bowel sounds.  EXTREMITIES:  No clubbing, cyanosis, or edema.  NEUROLOGIC:  Alert and oriented x 4.  Nonfocal exam.  SKIN:  Dry without focal lesion.   LABS:  CBC shows a white count of 11.7 with 78% segs and 11% lymphs,  hemoglobin 14.1, platelets 342.  B-MET significant for a bicarb of 24, BUN  13, creatinine 0.8, glucose 122.  D-dimmer mildly elevated at 0.64.  BNP  normal at 34.5.   STUDIES:  Chest x-ray, personally reviewed, shows no focal infiltrates.  Chest x-ray CT showed no evidence of pulmonary embolus.   ASSESSMENT/PLAN:  1.  Chronic obstructive pulmonary disease exacerbation.  We will admit her      for treatment with prednisone, frequent bronchodilators, and      antibiotics.  We will wean her oxygen  as needed.  She may need      outpatient oxygen.  I have discussed the benefits of smoking cessation.  2.  Hypertension.  We will hold her Toprol due to potential for worsening of      her bronchoconstriction.  Continue Norvasc and monitor her blood      pressure.  3.  Hyperlipidemia.  Continue Zocor.  4.  Peripheral vascular disease.  Continue aspirin and risk factor      modification.  5.  Fluids, electrolytes, nutrition.  Saline lock intravenous.  Cardiac      prudent diet.   DISPOSITION:  We will hopefully be able to discharge within 1-2 days if  improved.  She may need outpatient oxygen.      WS/MEDQ  D:  03/19/2005  T:  03/19/2005  Job:  [id number]   cc:   Loraine Leriche A. Waynard Edwards, M.D.  [address]  [place]  [place] [address]  Fax: [phone]

## 2011-04-30 NOTE — Discharge Summary (Signed)
[name], [name]                ACCOUNT NO.:  [id number]   MEDICAL RECORD NO.:  [id number]          PATIENT TYPE:  INP   LOCATION:  0352                         FACILITY:  [facility]   PHYSICIAN:  Kari Baars, M.D.  DATE OF BIRTH:  19-Jul-1928   DATE OF ADMISSION:  03/19/2005  DATE OF DISCHARGE:  03/21/2005                                 DISCHARGE SUMMARY   DISCHARGE DIAGNOSES:  1.  Chronic obstructive pulmonary disease exacerbation.  2.  Hypertension.  3.  Nicotine addiction.  4.  Anxiety disorder.  5.  History of renal cell cancer status post left nephrectomy.  6.  Irritable bowel syndrome.  7.  Hyperlipidemia.  8.  Osteoporosis.  9.  Diverticulosis.  10. Peripheral vascular disease.   DISCHARGE MEDICATIONS:  1.  Spiriva 18 mcg inhaled daily to replace Atrovent in her DuoNeb.  2.  Xopenex nebulizer 1.25/3 mL treatments every 4-6 hours as needed to      replace albuterol in DuoNeb.  3.  Prednisone taper 40 mg x2 days, then 20 mg x2 days, then 10 mg x2 days,      then 5 mg x2 days, then stop.  4.  Advair 250/50 inhaler twice daily.  5.  Avelox 400 mg daily x7 days.  6.  Norvasc 5 mg daily, which is an increased dose.  7.  Stop Toprol.  8.  Zocor 40 mg 1/2 daily.  9.  Aspirin 81 mg daily.  10. Mucinex p.r.n. cough.   HOSPITAL PROCEDURES:  1.  Chest x-ray (April 7), findings consistent with emphysema without acute      cardiopulmonary process.  2.  CT angiogram of the chest (April 7), no evidence of pulmonary embolus.      Scattered atherosclerotic calcifications and plaque in the thoracic      aorta with no acute process.   HISTORY OF PRESENT ILLNESS:  For full details, please see dictated History  and Physical.  Briefly, [name] is a 77 year old white female, a patient  of Dr. Waynard Edwards, with a history of COPD and hypertension who presented to the  emergency department with a complaint of increased shortness of breath x  less than 24 hours.  She had been using her  DuoNeb nebulizers without  significant improvement.  In fact, she felt like this exacerbated her  symptoms resulting in palpitations.  She does have frequent rapid heart rate  with the use of DuoNeb.  In the emergency department, she was found to have  mild hypoxia.  Chest x-ray showed no acute cardiopulmonary disease.  Findings consistent with COPD.  She did have a mildly elevated D-dimer so  the emergency department staff ordered a CT which was negative for pulmonary  embolus.  We were asked to admit her for further management of COPD  exacerbation.   HOSPITAL COURSE:  The patient was admitted to a telemetry bed for her COPD  exacerbation.  She was treated with prednisone, bronchodilator therapy using  Xopenex and Atrovent given her palpitations with albuterol.  She was also  started on Avelox for acute exacerbation of chronic bronchitis.  With this  treatment, her symptoms improved rapidly.  She was weaned from oxygen to  room air on the day of discharge and was able to walk in the hall without  significant hypoxia.  Of note, her Toprol was held due to her  bronchoconstriction on presentation.  To counteract this change, Norvasc was  increased from 2.5 to 5 mg daily.  Her blood pressure does remain somewhat  suboptimally controlled at this point and this may need to be increased to  10 mg if it does not control it on her followup visit.  Given improvement in  her symptoms, she was deemed stable for discharge home to complete a  prednisone taper, 10 days of Avelox, and continue on her inhaler treatments.  I have decided to change her Atrovent to Spiriva Handy-Haler and to change  her albuterol to Xopenex given the palpitations.  She was instructed on  proper use of these inhalers and given explicit instructions by me on the  proper timing of these inhalers.   DISCHARGE DIET:  Regular.   DISCHARGE INSTRUCTIONS:  She was strongly encouraged to stop smoking to  prevent the progression  of her COPD.  She will have pulmonary function tests  scheduled within the next several weeks.  She was instructed to call Dr.  Waynard Edwards if she has worsening shortness of breath or fever.   HOSPITAL FOLLOWUP:  She should follow up with Dr. Waynard Edwards within the next  week to verify appropriate use of her inhalers and to recheck her blood  pressure.   DISPOSITION:  To home.      WS/MEDQ  D:  03/21/2005  T:  03/21/2005  Job:  [id number]   cc:   Loraine Leriche A. Waynard Edwards, M.D.  [address]  [place]  [place] [address]  Fax: [phone]

## 2011-05-18 ENCOUNTER — Other Ambulatory Visit (HOSPITAL_COMMUNITY): Payer: MEDICARE

## 2011-05-18 ENCOUNTER — Other Ambulatory Visit: Payer: MEDICARE

## 2011-05-20 ENCOUNTER — Other Ambulatory Visit: Payer: Self-pay | Admitting: Cardiology

## 2011-05-20 ENCOUNTER — Other Ambulatory Visit: Payer: Self-pay | Admitting: *Deleted

## 2011-06-14 ENCOUNTER — Telehealth: Payer: Self-pay | Admitting: Cardiology

## 2011-06-14 NOTE — Telephone Encounter (Signed)
Patient has noticed some fluttering in her heart.  Started in May.  Lasts a few minutes and when she wakes up in the morning.  Has not been too bad, but thinks she may need to check it out.  i scheduled her an appt for 07/05 at 415.  See if needs sooner and please call.

## 2011-06-17 ENCOUNTER — Ambulatory Visit (INDEPENDENT_AMBULATORY_CARE_PROVIDER_SITE_OTHER): Payer: Medicare Other | Admitting: Cardiology

## 2011-06-17 ENCOUNTER — Encounter: Payer: Self-pay | Admitting: Cardiology

## 2011-06-17 ENCOUNTER — Telehealth: Payer: Self-pay | Admitting: Cardiology

## 2011-06-17 VITALS — BP 138/76 | HR 80 | Ht 62.0 in | Wt 94.4 lb

## 2011-06-17 DIAGNOSIS — R002 Palpitations: Secondary | ICD-10-CM

## 2011-06-17 NOTE — Patient Instructions (Signed)
We will have you wear a monitor for 24 hours.  We will call you with the results.

## 2011-06-17 NOTE — Progress Notes (Signed)
[name] Date of Birth: 01/14/1928   History of Present Illness: [name] is seen as a work in today. She reports that over the past 2 weeks she has had increased symptoms of fluttering in her chest. She states this happens when she first gets up in the morning but may occur several times during the day. It is not associated with activity. She feels that her heart races and then will call him back down. She is very concerned about these symptoms. She has been trying to stay cool in the recent heat wave.  Current Outpatient Prescriptions on File Prior to Visit  Medication Sig Dispense Refill  . amLODipine (NORVASC) 5 MG tablet Take 5 mg by mouth daily.       Marland Kitchen aspirin 81 MG tablet Take 81 mg by mouth daily.        . bimatoprost (LUMIGAN) 0.03 % ophthalmic drops 1 drop at bedtime.        . brimonidine (ALPHAGAN) 0.15 % ophthalmic solution 1 drop daily.        . brinzolamide (AZOPT) 1 % ophthalmic suspension 1 drop daily.        . calcium carbonate (OS-CAL) 600 MG TABS Take 600 mg by mouth daily.        . ergocalciferol (VITAMIN D2) 50000 UNITS capsule Take 50,000 Units by mouth once a week.        . Fluticasone-Salmeterol (ADVAIR HFA IN) Inhale into the lungs as needed.        . metoprolol tartrate (LOPRESSOR) 25 MG tablet TAKE ONE-HALF TABLET BY MOUTH DAILY  45 tablet  5  . PROAIR HFA 108 (90 BASE) MCG/ACT inhaler 2 Inhalers Ad lib.      . simvastatin (ZOCOR) 40 MG tablet Take 40 mg by mouth at bedtime.       . sodium chloride 0.9 % SOLN 1,000 mL with calcium chloride 10 % SOLN 8 g Inject 8 g into the vein as needed. YEARLY (SEPTEMBER)       . DISCONTD: metoprolol succinate (TOPROL-XL) 25 MG 24 hr tablet Take 12.5 mg by mouth daily.         No Known Allergies  Past Medical History  Diagnosis Date  . Aortic atherosclerosis   . PAD (peripheral artery disease)   . COPD (chronic obstructive pulmonary disease)   . HTN (hypertension)   . Hypercholesteremia   . Glaucoma   .  Bronchitis   . Fractured coccyx     Past Surgical History  Procedure Date  . Nephrectomy     LEFT/CANCER  . Renal cryoablation     Right with radiation    History  Smoking status  . Current Everyday Smoker -- 0.5 packs/day for 65 years  . Types: Cigarettes  Smokeless tobacco  . Not on file    History  Alcohol Use No    Family History  Problem Relation Age of Onset  . Heart attack Father   . Hypertension Father   . Cancer Mother     MOUTH,BREAST,SPINE    Review of Systems: All other systems were reviewed and are negative.  Physical Exam: BP 138/76  Pulse 80  Ht 5\' 2"  (1.575 m)  Wt 94 lb 6.4 oz (42.82 kg)  BMI 17.27 kg/m2 She is a thin elderly white female who is chronically ill-appearing. HEENT exam is unremarkable. She has no JVD or bruits. Lungs are clear. Cardiac exam is without gallop, murmur, or click. Abdomen is soft and nontender.  She has no lower extremity edema. She has very poor pedal pulses. Her skin is thin and atrophic. She is alert and oriented x3. Cranial nerves II through XII are intact. LABORATORY DATA: ECG demonstrates normal sinus rhythm with an intermittent left bundle branch block pattern. There are ST-T wave changes  consistent with inferior and lateral ischemia.  Assessment / Plan:

## 2011-06-17 NOTE — Assessment & Plan Note (Signed)
We will have her wear a 24-hour Holter monitor to try and document her arrhythmia. We will continue on her current low dose of metoprolol until we can identify the mechanism of her arrhythmia. Depending on the results of her study we may be able to increase her beta blocker therapy or add additional therapy.

## 2011-06-18 ENCOUNTER — Encounter: Payer: Self-pay | Admitting: Cardiology

## 2011-06-18 DIAGNOSIS — R002 Palpitations: Secondary | ICD-10-CM

## 2011-06-18 NOTE — Telephone Encounter (Signed)
errror

## 2011-06-23 ENCOUNTER — Telehealth: Payer: Self-pay | Admitting: *Deleted

## 2011-06-23 NOTE — Telephone Encounter (Signed)
Notified of 24hr monitor results.

## 2011-06-30 ENCOUNTER — Ambulatory Visit
Admission: RE | Admit: 2011-06-30 | Discharge: 2011-06-30 | Disposition: A | Discharge status: Home / Self Care | Payer: Medicare Other | Source: Ambulatory Visit | Attending: Interventional Radiology | Admitting: Interventional Radiology

## 2011-06-30 ENCOUNTER — Ambulatory Visit (HOSPITAL_COMMUNITY)
Admission: RE | Admit: 2011-06-30 | Discharge: 2011-06-30 | Disposition: A | Discharge status: Home / Self Care | Payer: Medicare Other | Source: Ambulatory Visit | Attending: Interventional Radiology | Admitting: Interventional Radiology

## 2011-06-30 DIAGNOSIS — I714 Abdominal aortic aneurysm, without rupture, unspecified: Secondary | ICD-10-CM | POA: Insufficient documentation

## 2011-06-30 DIAGNOSIS — C649 Malignant neoplasm of unspecified kidney, except renal pelvis: Secondary | ICD-10-CM | POA: Insufficient documentation

## 2011-06-30 DIAGNOSIS — Z905 Acquired absence of kidney: Secondary | ICD-10-CM | POA: Insufficient documentation

## 2011-06-30 MED ORDER — IOHEXOL 300 MG/ML  SOLN
100.0000 mL | Freq: Once | INTRAMUSCULAR | Status: AC | PRN
  Administered 2011-06-30: 100 mL via INTRAVENOUS

## 2011-06-30 NOTE — Progress Notes (Signed)
Pt denies hematuria.  Denies urinary frequency.  Occasionally up to void during the night.  Denies pain.  Denies n, v or diarrhea.  Appetite good.  Sleeping good.

## 2011-08-18 ENCOUNTER — Ambulatory Visit: Payer: Self-pay | Admitting: Cardiology

## 2011-09-07 LAB — CBC
HCT: 28.7 — ABNORMAL LOW
HCT: 37.1
Hemoglobin: 10 — ABNORMAL LOW
Hemoglobin: 9.9 — ABNORMAL LOW
MCHC: 34.2
MCV: 91.6
MCV: 92.7
Platelets: 399
RBC: 3.09 — ABNORMAL LOW
RBC: 3.16 — ABNORMAL LOW
RBC: 3.51 — ABNORMAL LOW
WBC: 10.9 — ABNORMAL HIGH
WBC: 12.9 — ABNORMAL HIGH
WBC: 22.9 — ABNORMAL HIGH

## 2011-09-07 LAB — BASIC METABOLIC PANEL
CO2: 26
Chloride: 102
Chloride: 105
Creatinine, Ser: 0.69
GFR calc Af Amer: >60
GFR calc Af Amer: >60
GFR calc Af Amer: >60
GFR calc non Af Amer: >60
Glucose, Bld: 115 — ABNORMAL HIGH
Potassium: 3.9
Potassium: 4
Potassium: 4.1
Sodium: 134 — ABNORMAL LOW
Sodium: 134 — ABNORMAL LOW

## 2011-09-07 LAB — COMPREHENSIVE METABOLIC PANEL
ALT: 16
AST: 18
Calcium: 8.7
GFR calc Af Amer: >60
Sodium: 137
Total Protein: 5.3 — ABNORMAL LOW

## 2011-09-07 LAB — URINALYSIS, ROUTINE W REFLEX MICROSCOPIC
Glucose, UA: NEGATIVE
Protein, ur: NEGATIVE
pH: 5.5

## 2011-09-07 LAB — DIFFERENTIAL
Eosinophils Absolute: 0.1
Eosinophils Relative: 0
Lymphs Abs: 2.6
Monocytes Absolute: 1.5 — ABNORMAL HIGH
Monocytes Relative: 7

## 2011-09-07 LAB — PROTIME-INR
INR: 1.1
INR: 1.8 — ABNORMAL HIGH
Prothrombin Time: 14

## 2011-09-14 LAB — CBC
HCT: 38.5
HCT: 43.7
Hemoglobin: 13
Platelets: 256
Platelets: 349
RBC: 4.04
RDW: 13.1
RDW: 13.3
RDW: 13.5
WBC: 8.3
WBC: 8.4
WBC: 8.9

## 2011-09-14 LAB — BASIC METABOLIC PANEL
BUN: 11
BUN: 7
Calcium: 7.9 — ABNORMAL LOW
Calcium: 9.4
Creatinine, Ser: 0.64
GFR calc non Af Amer: >60
GFR calc non Af Amer: >60
Glucose, Bld: 90
Potassium: 3.8
Potassium: 3.9
Sodium: 139

## 2011-09-14 LAB — PROTIME-INR: INR: 0.9

## 2011-09-14 LAB — CROSSMATCH
ABO/RH(D): A POS
Antibody Screen: NEGATIVE

## 2011-09-14 LAB — ABO/RH: ABO/RH(D): A POS

## 2011-09-14 LAB — APTT: aPTT: 24

## 2011-10-06 ENCOUNTER — Encounter: Payer: Self-pay | Admitting: Cardiology

## 2011-10-06 ENCOUNTER — Ambulatory Visit (INDEPENDENT_AMBULATORY_CARE_PROVIDER_SITE_OTHER): Payer: Medicare Other | Admitting: Cardiology

## 2011-10-06 VITALS — BP 130/84 | HR 81 | Ht 63.0 in | Wt 97.0 lb

## 2011-10-06 DIAGNOSIS — I739 Peripheral vascular disease, unspecified: Secondary | ICD-10-CM

## 2011-10-06 DIAGNOSIS — R002 Palpitations: Secondary | ICD-10-CM

## 2011-10-06 DIAGNOSIS — I714 Abdominal aortic aneurysm, without rupture, unspecified: Secondary | ICD-10-CM

## 2011-10-06 DIAGNOSIS — I10 Essential (primary) hypertension: Secondary | ICD-10-CM

## 2011-10-06 MED ORDER — METOPROLOL TARTRATE 25 MG PO TABS: 25.0000 mg | ORAL_TABLET | Freq: Every day | ORAL | Status: DC

## 2011-10-06 NOTE — Patient Instructions (Signed)
Continue your current medications.  I will see you again in 6 months.  Reduce your caffeine intake.  We will follow your aneurysm with your yearly CT scan.

## 2011-10-07 DIAGNOSIS — I714 Abdominal aortic aneurysm, without rupture: Secondary | ICD-10-CM | POA: Insufficient documentation

## 2011-10-07 NOTE — Assessment & Plan Note (Signed)
She has had no significant sustained arrhythmias. We will continue with her low-dose of beta blocker therapy. I recommended that she avoid caffeine.

## 2011-10-07 NOTE — Progress Notes (Signed)
   [name] Date of Birth: 21-May-1928   History of Present Illness: [name] is seen today for followup. She still experiences a little flutter sensation in her chest in the morning. It lasts less than 3-5 minutes. She's had no sustained episodes. She denies any dizziness, syncope, chest pain, or shortness of breath. He was noted on abdominal CT scan that she has a 4.5 cm abdominal aortic aneurysm. She gets yearly abdominal CT scans for followup of her renal cancer. Her aortic size has increased somewhat over the years with a measurement of 3.4 cm in 2003. This is asymptomatic.  Current Outpatient Prescriptions on File Prior to Visit  Medication Sig Dispense Refill  . bimatoprost (LUMIGAN) 0.03 % ophthalmic drops 1 drop at bedtime.        . brimonidine (ALPHAGAN) 0.15 % ophthalmic solution 1 drop daily.        . brinzolamide (AZOPT) 1 % ophthalmic suspension 1 drop daily.        . Fluticasone-Salmeterol (ADVAIR HFA IN) Inhale into the lungs as needed.        . metoprolol tartrate (LOPRESSOR) 25 MG tablet Take 1 tablet (25 mg total) by mouth daily.  45 tablet  5  . PROAIR HFA 108 (90 BASE) MCG/ACT inhaler 2 Inhalers Ad lib.      . sodium chloride 0.9 % SOLN 1,000 mL with calcium chloride 10 % SOLN 8 g Inject 8 g into the vein as needed. YEARLY (SEPTEMBER)         No Known Allergies  Past Medical History  Diagnosis Date  . Aortic atherosclerosis   . PAD (peripheral artery disease)   . COPD (chronic obstructive pulmonary disease)   . HTN (hypertension)   . Hypercholesteremia   . Glaucoma   . Bronchitis   . Fractured coccyx   . AAA (abdominal aortic aneurysm)   . Renal cell carcinoma     Past Surgical History  Procedure Date  . Nephrectomy     LEFT/CANCER  . Renal cryoablation     Right with radiation    History  Smoking status  . Current Everyday Smoker -- 0.5 packs/day for 65 years  . Types: Cigarettes  Smokeless tobacco  . Not on file    History  Alcohol  Use No    Family History  Problem Relation Age of Onset  . Heart attack Father   . Hypertension Father   . Cancer Mother     MOUTH,BREAST,SPINE    Review of Systems: As noted in history of present illness. All other systems were reviewed and are negative.  Physical Exam: BP 130/84  Pulse 81  Ht 5\' 3"  (1.6 m)  Wt 97 lb (43.999 kg)  BMI 17.18 kg/m2 She is a thin elderly white female who is chronically ill-appearing. HEENT exam is unremarkable. She has no JVD or bruits. Lungs are clear. Cardiac exam is without gallop, murmur, or click. Abdomen is soft and nontender. She has no lower extremity edema. She has very poor pedal pulses. Her skin is thin and atrophic. She is alert and oriented x3. Cranial nerves II through XII are intact. LABORATORY DATA: Chest x-ray dated 09/27/2011 shows no acute changes. Assessment / Plan:

## 2011-10-07 NOTE — Assessment & Plan Note (Signed)
Assessment early grown over the past 10 years. She gets yearly CT scans for followup of her renal cancer. I think this is an adequate followup. Would not recommend treatment unless he gets over 5 cm.

## 2011-10-07 NOTE — Assessment & Plan Note (Signed)
Blood pressure control is acceptable. 

## 2011-10-29 ENCOUNTER — Encounter: Payer: Self-pay | Admitting: Vascular Surgery

## 2011-11-22 ENCOUNTER — Encounter: Payer: Self-pay | Admitting: Vascular Surgery

## 2011-11-23 ENCOUNTER — Ambulatory Visit (INDEPENDENT_AMBULATORY_CARE_PROVIDER_SITE_OTHER): Payer: Medicare Other | Admitting: Vascular Surgery

## 2011-11-23 ENCOUNTER — Encounter: Payer: Self-pay | Admitting: Vascular Surgery

## 2011-11-23 VITALS — BP 155/87 | HR 92 | Resp 16 | Ht 62.0 in | Wt 94.0 lb

## 2011-11-23 DIAGNOSIS — I714 Abdominal aortic aneurysm, without rupture: Secondary | ICD-10-CM

## 2011-11-23 NOTE — Progress Notes (Signed)
Subjective:     Patient ID: [name], female   DOB: 1928/12/04, 77 y.o.   MRN: [id number]  HPI 77 year old female was referred for an abdominal aortic aneurysm which was discovered on a CT scan in July of 2012. Patient has a history of a left nephrectomy 17 years ago for cancer and has had cryotherapy for small right renal tumor. She is doing well from that standpoint. The aneurysm measured 4.5 x 4.0 cm in maximum diameter in July of 2012. Do not have previous knowledge of the aneurysm.   Past Medical History  Diagnosis Date  . Aortic atherosclerosis   . PAD (peripheral artery disease)   . COPD (chronic obstructive pulmonary disease)   . HTN (hypertension)   . Hypercholesteremia   . Glaucoma   . Bronchitis   . Fractured coccyx   . AAA (abdominal aortic aneurysm)   . PAD (peripheral artery disease)   . Renal cell carcinoma     History  Substance Use Topics  . Smoking status: Current Everyday Smoker -- 0.5 packs/day for 65 years    Types: Cigarettes  . Smokeless tobacco: Not on file  . Alcohol Use: No    Family History  Problem Relation Age of Onset  . Heart attack Father   . Hypertension Father   . Heart disease Father   . Cancer Mother     MOUTH,BREAST,SPINE  . Hypertension Mother   . Diabetes Daughter   . Hypertension Daughter     No Known Allergies  Current outpatient prescriptions:amLODipine (NORVASC) 5 MG tablet, Take 5 mg by mouth daily.  , Disp: , Rfl: ;  aspirin 81 MG tablet, Take 81 mg by mouth daily.  , Disp: , Rfl: ;  beta carotene w/minerals (OCUVITE) tablet, Take 1 tablet by mouth daily.  , Disp: , Rfl: ;  bimatoprost (LUMIGAN) 0.03 % ophthalmic drops, 1 drop at bedtime.  , Disp: , Rfl: ;  brimonidine (ALPHAGAN) 0.15 % ophthalmic solution, 1 drop daily.  , Disp: , Rfl:  brinzolamide (AZOPT) 1 % ophthalmic suspension, 1 drop daily.  , Disp: , Rfl: ;  ergocalciferol (VITAMIN D2) 50000 UNITS capsule, Take 50,000 Units by mouth once a week.  , Disp: , Rfl: ;   Fluticasone-Salmeterol (ADVAIR HFA IN), Inhale into the lungs as needed.  , Disp: , Rfl: ;  metoprolol tartrate (LOPRESSOR) 25 MG tablet, Take 1 tablet (25 mg total) by mouth daily., Disp: 45 tablet, Rfl: 5 PROAIR HFA 108 (90 BASE) MCG/ACT inhaler, 2 Inhalers Ad lib., Disp: , Rfl: ;  simvastatin (ZOCOR) 40 MG tablet, Take 40 mg by mouth at bedtime.  , Disp: , Rfl: ;  sodium chloride 0.9 % SOLN 1,000 mL with calcium chloride 10 % SOLN 8 g, Inject 8 g into the vein as needed. YEARLY (SEPTEMBER) , Disp: , Rfl: ;  rosuvastatin (CRESTOR) 20 MG tablet, Take 20 mg by mouth daily.  , Disp: , Rfl:   BP 155/87  Pulse 92  Resp 16  Ht 5\' 2"  (1.575 m)  Wt 94 lb (42.638 kg)  BMI 17.19 kg/m2  SpO2 97%  Body mass index is 17.19 kg/(m^2).         Review of Systems she complains of dyspnea on exertion, bilateral leg claudication symptoms, friable bones, he fell and fractured her left wrist yesterday and will be seen later today by orthopedic surgeon, occasional wheezing from COPD, other systems are negative and a complete review of system     Objective:  Physical Exam Blood pressure 155/87 heart rate 92 respirations 16 General she is an elderly frail-appearing female is in no apparent distress alert and oriented x3 HEENT normal for age next Lungs no rhonchi or wheezing Cardiovascular regular and no murmurs carotid pulses 3+ no audible bruits Abdomen soft with 4/2 cm pulsatile mass which is nontender Exline lower extremities reveal 3+ femoral pulses absent popliteal and distal pulses Neurologic normal Musculoskeletal she has bruising and swelling over the left wrist where she fractured this yesterday Skin easy friability   I reviewed her CT angiogram which was done in July of 2012. She does not appear to be a stent graft candidate because the aneurysm extends up to her right renal artery which is her only kidney.    Assessment:    4.5 cm infrarenal abdominal aortic aneurysm    Plan:    patient  to return in 9 months for duplex scan of the aneurysm in my office and we will continue to follow this-not a stent graft candidate

## 2011-11-23 NOTE — Progress Notes (Signed)
Addended by: Adria Dill L on: 11/23/2011 10:04 AM   Modules accepted: Orders

## 2011-12-02 ENCOUNTER — Telehealth: Payer: Self-pay | Admitting: Cardiology

## 2011-12-02 MED ORDER — METOPROLOL TARTRATE 25 MG PO TABS: 25.0000 mg | ORAL_TABLET | Freq: Every day | ORAL | Status: DC

## 2011-12-02 NOTE — Telephone Encounter (Signed)
New message:  Patient needs metoprolol called in.  She has 4 left.  This needs to be called in as a new dose.  Karin Golden on [employer].

## 2011-12-02 NOTE — Telephone Encounter (Signed)
Spoke with patient regarding dosage, and refilled metoprolol to pharmacy

## 2012-01-11 ENCOUNTER — Other Ambulatory Visit: Payer: Self-pay | Admitting: Internal Medicine

## 2012-01-11 DIAGNOSIS — Z1231 Encounter for screening mammogram for malignant neoplasm of breast: Secondary | ICD-10-CM

## 2012-02-21 ENCOUNTER — Ambulatory Visit
Admission: RE | Admit: 2012-02-21 | Discharge: 2012-02-21 | Disposition: A | Discharge status: Home / Self Care | Payer: Medicare Other | Source: Ambulatory Visit | Attending: Internal Medicine | Admitting: Internal Medicine

## 2012-02-21 DIAGNOSIS — Z1231 Encounter for screening mammogram for malignant neoplasm of breast: Secondary | ICD-10-CM

## 2012-04-05 ENCOUNTER — Ambulatory Visit (INDEPENDENT_AMBULATORY_CARE_PROVIDER_SITE_OTHER): Payer: Medicare Other | Admitting: Cardiology

## 2012-04-05 ENCOUNTER — Encounter: Payer: Self-pay | Admitting: Cardiology

## 2012-04-05 VITALS — BP 124/83 | HR 92 | Ht 62.0 in | Wt 96.0 lb

## 2012-04-05 DIAGNOSIS — J449 Chronic obstructive pulmonary disease, unspecified: Secondary | ICD-10-CM

## 2012-04-05 DIAGNOSIS — I714 Abdominal aortic aneurysm, without rupture: Secondary | ICD-10-CM

## 2012-04-05 DIAGNOSIS — R002 Palpitations: Secondary | ICD-10-CM

## 2012-04-05 DIAGNOSIS — I10 Essential (primary) hypertension: Secondary | ICD-10-CM

## 2012-04-05 DIAGNOSIS — I779 Disorder of arteries and arterioles, unspecified: Secondary | ICD-10-CM

## 2012-04-05 DIAGNOSIS — I739 Peripheral vascular disease, unspecified: Secondary | ICD-10-CM

## 2012-04-05 NOTE — Assessment & Plan Note (Signed)
Blood pressure is under excellent control. 

## 2012-04-05 NOTE — Assessment & Plan Note (Signed)
Her aneurysm measured 4.5 cm one year ago. She will follow with vascular surgery and repeat evaluation. She is asymptomatic.

## 2012-04-05 NOTE — Assessment & Plan Note (Signed)
Her palpitations have actually improved from her last visit. We will continue with low-dose metoprolol.

## 2012-04-05 NOTE — Progress Notes (Signed)
[name] Date of Birth: July 26, 1928   History of Present Illness: [name] is seen today for followup. She is doing very well from a cardiac standpoint. She does state that since she was told she had aneurysm on her last visit she's been very worried about this. She is anxious that it may rupture at any time. She denies any bowel discomfort or back pain. She is scheduled to see Dr. Hart Rochester next month for evaluation of her aneurysm. She denies any significant palpitations. Her breathing is doing fairly well. He denies any chest pain.   Current Outpatient Prescriptions on File Prior to Visit  Medication Sig Dispense Refill  . amLODipine (NORVASC) 5 MG tablet Take 5 mg by mouth daily.        Marland Kitchen aspirin 81 MG tablet Take 81 mg by mouth daily.        . beta carotene w/minerals (OCUVITE) tablet Take 1 tablet by mouth daily.        . bimatoprost (LUMIGAN) 0.03 % ophthalmic drops 1 drop at bedtime.        . brimonidine (ALPHAGAN) 0.15 % ophthalmic solution 1 drop daily.        . brinzolamide (AZOPT) 1 % ophthalmic suspension 1 drop daily.        . ergocalciferol (VITAMIN D2) 50000 UNITS capsule Take 50,000 Units by mouth once a week.        . Fluticasone-Salmeterol (ADVAIR HFA IN) Inhale into the lungs as needed.        . metoprolol tartrate (LOPRESSOR) 25 MG tablet Take 1 tablet (25 mg total) by mouth daily.  90 tablet  3  . PROAIR HFA 108 (90 BASE) MCG/ACT inhaler 2 Inhalers Ad lib.      . rosuvastatin (CRESTOR) 20 MG tablet Take 20 mg by mouth daily.        . simvastatin (ZOCOR) 40 MG tablet Take 40 mg by mouth at bedtime.        . sodium chloride 0.9 % SOLN 1,000 mL with calcium chloride 10 % SOLN 8 g Inject 8 g into the vein as needed. YEARLY (SEPTEMBER)         No Known Allergies  Past Medical History  Diagnosis Date  . Aortic atherosclerosis   . PAD (peripheral artery disease)   . COPD (chronic obstructive pulmonary disease)   . HTN (hypertension)   . Hypercholesteremia   .  Glaucoma   . Bronchitis   . Fractured coccyx   . AAA (abdominal aortic aneurysm)   . PAD (peripheral artery disease)   . Renal cell carcinoma     Past Surgical History  Procedure Date  . Nephrectomy     LEFT/CANCER  . Renal cryoablation     Right with radiation  . Hip fracture surgery 2009    History  Smoking status  . Current Everyday Smoker -- 0.5 packs/day for 65 years  . Types: Cigarettes  Smokeless tobacco  . Not on file    History  Alcohol Use No    Family History  Problem Relation Age of Onset  . Heart attack Father   . Hypertension Father   . Heart disease Father   . Cancer Mother     MOUTH,BREAST,SPINE  . Hypertension Mother   . Diabetes Daughter   . Hypertension Daughter     Review of Systems: As noted in history of present illness. All other systems were reviewed and are negative.  Physical Exam: BP 124/83  Pulse  92  Ht 5\' 2"  (1.575 m)  Wt 96 lb (43.545 kg)  BMI 17.56 kg/m2 She is a thin elderly white female who is chronically ill-appearing. HEENT exam is unremarkable. She has no JVD or bruits. Lungs are clear. Cardiac exam is without gallop, murmur, or click. Abdomen is soft and nontender. She has no lower extremity edema. She has very poor pedal pulses. Her skin is thin and atrophic. She is alert and oriented x3. Cranial nerves II through XII are intact. LABORATORY DATA:  Assessment / Plan:

## 2012-04-05 NOTE — Patient Instructions (Signed)
Continue your current medication.  I will see you again in 6 months.   

## 2012-05-10 ENCOUNTER — Encounter (HOSPITAL_COMMUNITY): Payer: Self-pay

## 2012-05-10 ENCOUNTER — Encounter (HOSPITAL_COMMUNITY)
Admission: RE | Admit: 2012-05-10 | Discharge: 2012-05-10 | Disposition: A | Discharge status: Home / Self Care | Payer: Medicare Other | Source: Ambulatory Visit | Attending: Internal Medicine | Admitting: Internal Medicine

## 2012-05-10 DIAGNOSIS — M810 Age-related osteoporosis without current pathological fracture: Secondary | ICD-10-CM | POA: Insufficient documentation

## 2012-05-10 MED ORDER — SODIUM CHLORIDE 0.9 % IV SOLN
INTRAVENOUS | Status: AC
  Administered 2012-05-10: 15:00:00 via INTRAVENOUS

## 2012-05-10 MED ORDER — ZOLEDRONIC ACID 5 MG/100ML IV SOLN
5.0000 mg | Freq: Once | INTRAVENOUS | Status: AC
  Administered 2012-05-10: 5 mg via INTRAVENOUS
  Filled 2012-05-10: qty 100

## 2012-05-10 NOTE — Discharge Instructions (Signed)
Zoledronic Acid injection (Paget's Disease, Osteoporosis) What is this medicine? ZOLEDRONIC ACID (ZOE le dron ik AS id) lowers the amount of calcium loss from bone. It is used to treat Paget's disease and osteoporosis in women. This medicine may be used for other purposes; ask your health care provider or pharmacist if you have questions. What should I tell my health care provider before I take this medicine? They need to know if you have any of these conditions: -aspirin-sensitive asthma -dental disease -kidney disease -low levels of calcium in the blood -past surgery on the parathyroid gland or intestines -an unusual or allergic reaction to zoledronic acid, other medicines, foods, dyes, or preservatives -pregnant or trying to get pregnant -breast-feeding How should I use this medicine? This medicine is for infusion into a vein. It is given by a health care professional in a hospital or clinic setting. Talk to your pediatrician regarding the use of this medicine in children. This medicine is not approved for use in children. Overdosage: If you think you have taken too much of this medicine contact a poison control center or emergency room at once. NOTE: This medicine is only for you. Do not share this medicine with others. What if I miss a dose? It is important not to miss your dose. Call your doctor or health care professional if you are unable to keep an appointment. What may interact with this medicine? -certain antibiotics given by injection -NSAIDs, medicines for pain and inflammation, like ibuprofen or naproxen -some diuretics like bumetanide, furosemide -teriparatide This list may not describe all possible interactions. Give your health care provider a list of all the medicines, herbs, non-prescription drugs, or dietary supplements you use. Also tell them if you smoke, drink alcohol, or use illegal drugs. Some items may interact with your medicine. What should I watch for while  using this medicine? Visit your doctor or health care professional for regular checkups. It may be some time before you see the benefit from this medicine. Do not stop taking your medicine unless your doctor tells you to. Your doctor may order blood tests or other tests to see how you are doing. Women should inform their doctor if they wish to become pregnant or think they might be pregnant. There is a potential for serious side effects to an unborn child. Talk to your health care professional or pharmacist for more information. You should make sure that you get enough calcium and vitamin D while you are taking this medicine. Discuss the foods you eat and the vitamins you take with your health care professional. Some people who take this medicine have severe bone, joint, and/or muscle pain. This medicine may also increase your risk for a broken thigh bone. Tell your doctor right away if you have pain in your upper leg or groin. Tell your doctor if you have any pain that does not go away or that gets worse. What side effects may I notice from receiving this medicine? Side effects that you should report to your doctor or health care professional as soon as possible: -allergic reactions like skin rash, itching or hives, swelling of the face, lips, or tongue -breathing problems -changes in vision -feeling faint or lightheaded, falls -jaw burning, cramping, or pain -muscle cramps, stiffness, or weakness -trouble passing urine or change in the amount of urine Side effects that usually do not require medical attention (report to your doctor or health care professional if they continue or are bothersome): -bone, joint, or muscle pain -fever -  irritation at site where injected -loss of appetite -nausea, vomiting -stomach upset -tired This list may not describe all possible side effects. Call your doctor for medical advice about side effects. You may report side effects to FDA at 1-800-FDA-1088. Where  should I keep my medicine? This drug is given in a hospital or clinic and will not be stored at home. NOTE: This sheet is a summary. It may not cover all possible information. If you have questions about this medicine, talk to your doctor, pharmacist, or health care provider.  2012, [employer] Standard. (05/28/2011 9:08:15 AM) 

## 2012-05-18 ENCOUNTER — Other Ambulatory Visit: Payer: Self-pay | Admitting: Interventional Radiology

## 2012-05-18 DIAGNOSIS — C649 Malignant neoplasm of unspecified kidney, except renal pelvis: Secondary | ICD-10-CM

## 2012-05-22 ENCOUNTER — Encounter: Payer: Self-pay | Admitting: Neurosurgery

## 2012-05-23 ENCOUNTER — Other Ambulatory Visit: Payer: Medicare Other

## 2012-05-23 ENCOUNTER — Encounter (INDEPENDENT_AMBULATORY_CARE_PROVIDER_SITE_OTHER): Payer: Medicare Other | Admitting: *Deleted

## 2012-05-23 ENCOUNTER — Encounter: Payer: Self-pay | Admitting: Neurosurgery

## 2012-05-23 ENCOUNTER — Ambulatory Visit (INDEPENDENT_AMBULATORY_CARE_PROVIDER_SITE_OTHER): Payer: Medicare Other | Admitting: Neurosurgery

## 2012-05-23 ENCOUNTER — Ambulatory Visit: Payer: Medicare Other | Admitting: Vascular Surgery

## 2012-05-23 VITALS — BP 159/89 | HR 84 | Resp 14 | Ht 62.0 in | Wt 94.5 lb

## 2012-05-23 DIAGNOSIS — I714 Abdominal aortic aneurysm, without rupture, unspecified: Secondary | ICD-10-CM

## 2012-05-23 NOTE — Progress Notes (Signed)
[facility] HISTORY AND PHYSICAL   CC: Six-month AAA duplex Referring Physician: Hart Rochester  History of Present Illness: 78 year old female was referred for an abdominal aortic aneurysm which was discovered on a CT scan in July of 2012. Patient has a history of a left nephrectomy 17 years ago for cancer and has had cryotherapy for small right renal tumor. She is doing well from that standpoint. The aneurysm measured 4.5 x 4.0 cm in maximum diameter in July of 2012) duplex today has not increased. The patient has no other complaints, she denies any new medical diagnoses or any recent surgeries. The patient denies any abdominal or back pain although she does state she has some "heartburn" from time to time but this is not consistent.   Past Medical History  Diagnosis Date  . Aortic atherosclerosis   . PAD (peripheral artery disease)   . COPD (chronic obstructive pulmonary disease)   . HTN (hypertension)   . Hypercholesteremia   . Glaucoma   . Bronchitis   . Fractured coccyx   . AAA (abdominal aortic aneurysm)   . PAD (peripheral artery disease)   . Renal cell carcinoma 1995    Kidney removal    ROS: [x]  Positive   [ ]  Denies    General: [ ]  Weight loss, [ ]  Fever, [ ]  chills Neurologic: [ ]  Dizziness, [ ]  Blackouts, [ ]  Seizure [ ]  Stroke, [ ]  "Mini stroke", [ ]  Slurred speech, [ ]  Temporary blindness; [ ]  weakness in arms or legs, [ ]  Hoarseness Cardiac: [ ]  Chest pain/pressure, [ ]  Shortness of breath at rest [ ]  Shortness of breath with exertion, [ ]  Atrial fibrillation or irregular heartbeat Vascular: [ ]  Pain in legs with walking, [ ]  Pain in legs at rest, [ ]  Pain in legs at night,  [ ]  Non-healing ulcer, [ ]  Blood clot in vein/DVT,   Pulmonary: [ ]  Home oxygen, [ ]  Productive cough, [ ]  Coughing up blood, [ ]  Asthma,  [ ]  Wheezing Musculoskeletal:  [ ]  Arthritis, [ ]  Low back pain, [ ]  Joint pain Hematologic: [ ]  Easy Bruising, [ ]  Anemia; [ ]   Hepatitis Gastrointestinal: [ ]  Blood in stool, [ ]  Gastroesophageal Reflux/heartburn, [ ]  Trouble swallowing Urinary: [ ]  chronic Kidney disease, [ ]  on HD - [ ]  MWF or [ ]  TTHS, [ ]  Burning with urination, [ ]  Difficulty urinating Skin: [ ]  Rashes, [ ]  Wounds Psychological: [ ]  Anxiety, [ ]  Depression   Social History History  Substance Use Topics  . Smoking status: Current Everyday Smoker -- 0.5 packs/day for 65 years    Types: Cigarettes  . Smokeless tobacco: Not on file  . Alcohol Use: No    Family History Family History  Problem Relation Age of Onset  . Heart attack Father   . Hypertension Father   . Heart disease Father   . Cancer Mother     MOUTH,BREAST,SPINE  . Hypertension Mother   . Diabetes Daughter   . Hypertension Daughter     No Known Allergies  Current Outpatient Prescriptions  Medication Sig Dispense Refill  . amLODipine (NORVASC) 5 MG tablet Take 5 mg by mouth daily.        Marland Kitchen aspirin 81 MG tablet Take 81 mg by mouth daily.        . beta carotene w/minerals (OCUVITE) tablet Take 1 tablet by mouth daily.        . bimatoprost (LUMIGAN) 0.03 %  ophthalmic drops 1 drop at bedtime.        . brimonidine (ALPHAGAN) 0.15 % ophthalmic solution 1 drop daily.        . brinzolamide (AZOPT) 1 % ophthalmic suspension 1 drop daily.        . ergocalciferol (VITAMIN D2) 50000 UNITS capsule Take 50,000 Units by mouth once a week.        . Fluticasone-Salmeterol (ADVAIR HFA IN) Inhale into the lungs as needed.        . metoprolol tartrate (LOPRESSOR) 25 MG tablet Take 1 tablet (25 mg total) by mouth daily.  90 tablet  3  . PROAIR HFA 108 (90 BASE) MCG/ACT inhaler 2 Inhalers Ad lib.      . rosuvastatin (CRESTOR) 20 MG tablet Take 20 mg by mouth daily.        . simvastatin (ZOCOR) 40 MG tablet Take 40 mg by mouth at bedtime.        . sodium chloride 0.9 % SOLN 1,000 mL with calcium chloride 10 % SOLN 8 g Inject 8 g into the vein as needed. YEARLY (SEPTEMBER)          Physical Examination  Filed Vitals:   05/23/12 0933  BP: 159/89  Pulse: 84  Resp: 14    Body mass index is 17.28 kg/(m^2).  General:  WDWN in NAD Gait: Normal HEENT: WNL Eyes: Pupils equal Pulmonary: normal non-labored breathing , without Rales, rhonchi,  wheezing Cardiac: RRR, without  Murmurs, rubs or gallops; No carotid bruits Abdomen: soft, NT, no masses Skin: no rashes, ulcers noted Vascular Exam/Pulses: Pulsatile abdominal mass noted, 2+ femoral, pedal pulses bilaterally  Extremities without ischemic changes, no Gangrene , no cellulitis; no open wounds;  Musculoskeletal: no muscle wasting or atrophy  Neurologic: A&O X 3; Appropriate Affect ; SENSATION: normal; MOTOR FUNCTION:  moving all extremities equally. Speech is fluent/normal  Non-Invasive Vascular Imaging: AAA duplex today shows proximal measurement of 4.4 cm the iliacs are not measured due to bowel gas.  ASSESSMENT/PLAN: Asymptomatic AAA that is stable from previous CT to today's duplex, the patient will followup in 6 months for repeat duplex of the AAA and be seen in my clinic. Her questions were encouraged and answered. She knows the signs and symptoms of rupture and knows to report to the nearest emergency room should this occur.  Lauree Chandler ANP  Clinic M.D.: [place]

## 2012-05-24 NOTE — Addendum Note (Signed)
Addended by: Sharee Pimple on: 05/24/2012 10:11 AM   Modules accepted: Orders

## 2012-05-29 NOTE — Procedures (Unsigned)
DUPLEX ULTRASOUND OF ABDOMINAL AORTA  INDICATION:  AAA  HISTORY: Diabetes:  No Cardiac: Hypertension:  Yes Smoking:  Yes Connective Tissue Disorder: Family History:  No Previous Surgery:  No  DUPLEX EXAM:         AP (cm)                   TRANSVERSE (cm) Proximal             4.47 cm                   cm Mid                  3.53 cm                   cm Distal               2.49 cm                   cm Right Iliac          Not visualized            cm Left Iliac           Not visualized            cm  PREVIOUS:  Date:  06/30/2011  CT  AP:  4.5  TRANSVERSE:  4.1  IMPRESSION: 1. Bilobed abdominal aortic aneurysm in the proximal and mid abdominal     aorta segment. 2. Maximum diameter measured today of 4.47 cm AP. 3. Iliacs were unable to be visualized due to overlying bowel gas.  ___________________________________________ Quita Skye. Hart Rochester, M.D.  EM/MEDQ  D:  05/23/2012  T:  05/23/2012  Job:  [id number]

## 2012-06-05 ENCOUNTER — Other Ambulatory Visit: Payer: Self-pay | Admitting: Radiology

## 2012-06-05 ENCOUNTER — Other Ambulatory Visit: Payer: Self-pay | Admitting: Emergency Medicine

## 2012-06-05 DIAGNOSIS — C649 Malignant neoplasm of unspecified kidney, except renal pelvis: Secondary | ICD-10-CM

## 2012-06-13 ENCOUNTER — Other Ambulatory Visit: Payer: Self-pay | Admitting: Emergency Medicine

## 2012-06-13 DIAGNOSIS — C649 Malignant neoplasm of unspecified kidney, except renal pelvis: Secondary | ICD-10-CM

## 2012-07-12 ENCOUNTER — Ambulatory Visit
Admission: RE | Admit: 2012-07-12 | Discharge: 2012-07-12 | Disposition: A | Discharge status: Home / Self Care | Payer: Medicare Other | Source: Ambulatory Visit | Attending: Interventional Radiology | Admitting: Interventional Radiology

## 2012-07-12 ENCOUNTER — Ambulatory Visit (HOSPITAL_COMMUNITY)
Admission: RE | Admit: 2012-07-12 | Discharge: 2012-07-12 | Disposition: A | Discharge status: Home / Self Care | Payer: Medicare Other | Source: Ambulatory Visit | Attending: Interventional Radiology | Admitting: Interventional Radiology

## 2012-07-12 DIAGNOSIS — I714 Abdominal aortic aneurysm, without rupture, unspecified: Secondary | ICD-10-CM | POA: Insufficient documentation

## 2012-07-12 DIAGNOSIS — Z905 Acquired absence of kidney: Secondary | ICD-10-CM | POA: Insufficient documentation

## 2012-07-12 DIAGNOSIS — C649 Malignant neoplasm of unspecified kidney, except renal pelvis: Secondary | ICD-10-CM | POA: Insufficient documentation

## 2012-07-12 MED ORDER — IOHEXOL 300 MG/ML  SOLN
100.0000 mL | Freq: Once | INTRAMUSCULAR | Status: AC | PRN
  Administered 2012-07-12: 100 mL via INTRAVENOUS

## 2012-07-12 NOTE — Progress Notes (Signed)
Patient ID: [name], female   DOB: 29-Jun-1928, 78 y.o.   MRN: [id number]  ESTABLISHED PATIENT OFFICE VISIT  Chief Complaint: Status post original radiofrequency ablation of a right renal carcinoma in November, 2009 followed by cryoablation of a nodular recurrence on 05/08/2010.  History: Mrs. Wolven returns for follow-up. She has been doing well with no complaints. She has had no significant recent medical problems.  Review of Systems: The patient denies hematuria, abdominal pain, flank pain or dysuria. She has had no fever.  Exam: Vital signs: Blood pressure 149/96, pulse 94, respirations 18, oxygen saturation 96% on room air. Abdomen: Soft and nontender. No flank tenderness.  Labs: BUN 14, creatinine 0.74 and estimated GFR 76 ml/minute on 07/06/2012.  Imaging: Follow-up CT was performed today and demonstrates stable right renal ablation defect without evidence of enhancement after administration of contrast. Abdominal aortic aneurysm was measured and felt to potentially be very slightly larger in size compared to a prior study in 2012.  Assessment and Plan: No evidence of recurrent right renal carcinoma just over 1 year status post retreatment of a nodular recurrence. The abdominal aortic aneurysm is being followed [facility] with recent ultrasound on 05/23/2012 demonstrating maximal diameter of 4.5 cm. The patient is asymptomatic with respect to the aneurysm. I have recommended follow-up in 1 year with repeat CT.

## 2012-07-13 NOTE — Progress Notes (Signed)
Patient denies hematuria or any other problems w/ urination.  Denies pain associated w/ procedure.  States that she is "doing great".

## 2012-08-22 ENCOUNTER — Encounter: Payer: Self-pay | Admitting: Cardiology

## 2012-08-26 ENCOUNTER — Other Ambulatory Visit: Payer: Self-pay | Admitting: Cardiology

## 2012-08-31 ENCOUNTER — Encounter: Payer: Self-pay | Admitting: Cardiology

## 2012-10-04 ENCOUNTER — Ambulatory Visit: Payer: Medicare Other | Admitting: Cardiology

## 2012-10-20 ENCOUNTER — Ambulatory Visit: Payer: Medicare Other | Admitting: Cardiology

## 2012-10-27 ENCOUNTER — Encounter: Payer: Self-pay | Admitting: Cardiology

## 2012-10-27 ENCOUNTER — Ambulatory Visit (INDEPENDENT_AMBULATORY_CARE_PROVIDER_SITE_OTHER): Payer: Medicare Other | Admitting: Cardiology

## 2012-10-27 VITALS — BP 118/77 | HR 93 | Ht 62.0 in | Wt 90.2 lb

## 2012-10-27 DIAGNOSIS — F17200 Nicotine dependence, unspecified, uncomplicated: Secondary | ICD-10-CM

## 2012-10-27 DIAGNOSIS — I10 Essential (primary) hypertension: Secondary | ICD-10-CM

## 2012-10-27 DIAGNOSIS — Z720 Tobacco use: Secondary | ICD-10-CM

## 2012-10-27 DIAGNOSIS — I700 Atherosclerosis of aorta: Secondary | ICD-10-CM

## 2012-10-27 DIAGNOSIS — I714 Abdominal aortic aneurysm, without rupture: Secondary | ICD-10-CM

## 2012-10-27 HISTORY — DX: Tobacco use: Z72.0

## 2012-10-27 NOTE — Progress Notes (Signed)
[name] Date of Birth: 08-Mar-1928   History of Present Illness: [name] is seen today for followup. She is doing very well from a cardiac standpoint. She remains very anxious about her abdominal aneurysm. Her last ultrasound in June showed a 4.5 cm aneurysm. She is scheduled for repeat study next month. She has no symptoms of chest pain, abdominal pain, or back pain. She does continue to smoke.  Current Outpatient Prescriptions on File Prior to Visit  Medication Sig Dispense Refill  . amLODipine (NORVASC) 5 MG tablet Take 5 mg by mouth daily.        Marland Kitchen aspirin 81 MG tablet Take 81 mg by mouth daily.        . beta carotene w/minerals (OCUVITE) tablet Take 1 tablet by mouth daily.        . bimatoprost (LUMIGAN) 0.03 % ophthalmic drops 1 drop at bedtime.        . brimonidine (ALPHAGAN) 0.15 % ophthalmic solution 1 drop daily.        . brinzolamide (AZOPT) 1 % ophthalmic suspension 1 drop daily.        . ergocalciferol (VITAMIN D2) 50000 UNITS capsule Take 50,000 Units by mouth once a week.        . Fluticasone-Salmeterol (ADVAIR HFA IN) Inhale into the lungs as needed.        . metoprolol tartrate (LOPRESSOR) 25 MG tablet TAKE ONE TABLET (25 MG TOTAL) BY MOUTH DAILY.  30 tablet  6  . PROAIR HFA 108 (90 BASE) MCG/ACT inhaler 2 Inhalers Ad lib.      . rosuvastatin (CRESTOR) 20 MG tablet Take 20 mg by mouth daily.        . simvastatin (ZOCOR) 40 MG tablet Take 40 mg by mouth at bedtime.        . sodium chloride 0.9 % SOLN 1,000 mL with calcium chloride 10 % SOLN 8 g Inject 8 g into the vein as needed. YEARLY (SEPTEMBER)         No Known Allergies  Past Medical History  Diagnosis Date  . Aortic atherosclerosis   . PAD (peripheral artery disease)   . COPD (chronic obstructive pulmonary disease)   . HTN (hypertension)   . Hypercholesteremia   . Glaucoma(365)   . Bronchitis   . Fractured coccyx   . AAA (abdominal aortic aneurysm)   . PAD (peripheral artery disease)   . Renal  cell carcinoma 1995    Kidney removal  . Tobacco abuse 10/27/2012    Past Surgical History  Procedure Date  . Nephrectomy     LEFT/CANCER  . Renal cryoablation     Right with radiation  . Hip fracture surgery 2009    History  Smoking status  . Current Every Day Smoker -- 0.5 packs/day for 65 years  . Types: Cigarettes  Smokeless tobacco  . Not on file    History  Alcohol Use No    Family History  Problem Relation Age of Onset  . Heart attack Father   . Hypertension Father   . Heart disease Father   . Cancer Mother     MOUTH,BREAST,SPINE  . Hypertension Mother   . Diabetes Daughter   . Hypertension Daughter     Review of Systems: As noted in history of present illness. She does have declining vision related to glaucoma. She no longer drives. All other systems were reviewed and are negative.  Physical Exam: BP 118/77  Pulse 93  Ht 5'  2" (1.575 m)  Wt 40.914 kg (90 lb 3.2 oz)  BMI 16.50 kg/m2  SpO2 98% She is a thin elderly white female who is chronically ill-appearing. HEENT exam is unremarkable. She has no JVD or bruits. Lungs are clear. Cardiac exam is without gallop, murmur, or click. Abdomen is soft and nontender. She has no lower extremity edema. She has  poor pedal pulses. Her skin is thin and atrophic. She is alert and oriented x3. Cranial nerves II through XII are intact.  LABORATORY DATA: ECG demonstrates normal sinus rhythm with a normal ECG. Assessment / Plan: 1. Abdominal aortic aneurysm. Size is stable on last exam. Repeat ultrasound next month. I told her the best way to reduce her risk was to quit smoking.  2. Hypertension, well controlled.  3. Palpitations, resolved.  4. Tobacco abuse. Counseled patient on smoking cessation. She is not ready to quit.

## 2012-10-27 NOTE — Patient Instructions (Signed)
Quit smoking completely.  Continue your current therapy  I will see you again in 6 months.

## 2012-11-27 ENCOUNTER — Encounter: Payer: Self-pay | Admitting: Neurosurgery

## 2012-11-28 ENCOUNTER — Ambulatory Visit (INDEPENDENT_AMBULATORY_CARE_PROVIDER_SITE_OTHER): Payer: Medicare Other | Admitting: Vascular Surgery

## 2012-11-28 ENCOUNTER — Ambulatory Visit (INDEPENDENT_AMBULATORY_CARE_PROVIDER_SITE_OTHER): Payer: Medicare Other | Admitting: Neurosurgery

## 2012-11-28 ENCOUNTER — Encounter: Payer: Self-pay | Admitting: Neurosurgery

## 2012-11-28 VITALS — BP 134/85 | HR 68 | Resp 14 | Ht 62.0 in | Wt 90.2 lb

## 2012-11-28 DIAGNOSIS — I714 Abdominal aortic aneurysm, without rupture, unspecified: Secondary | ICD-10-CM

## 2012-11-28 NOTE — Progress Notes (Signed)
Abdominal aorta duplex performed @ [facility] 11/28/2012

## 2012-11-28 NOTE — Progress Notes (Signed)
[facility] AAA/Carotid Office Note  CC: AAA surveillance Referring Physician: Hart Rochester  History of Present Illness: 78 year old female patient followed  for an abdominal aortic aneurysm which was discovered on a CT scan in July of 2012. The patient has a history of a left nephrectomy 17 years ago for cancer and has had cryotherapy for small right renal tumor. She is doing well from that standpoint. The aneurysm measured 4.5 x 4.0 cm in maximum diameter in July of 2012. The patient denies any unusual abdominal or back pain. The patient is being followed by her primary care doctor for her nephrology difficulties and does not have the nephrologist or urologist. The patient denies any other new medical diagnoses or recent surgeries, she does continue to smoke.   Past Medical History  Diagnosis Date  . Aortic atherosclerosis   . PAD (peripheral artery disease)   . COPD (chronic obstructive pulmonary disease)   . HTN (hypertension)   . Hypercholesteremia   . Glaucoma(365)   . Bronchitis   . Fractured coccyx   . AAA (abdominal aortic aneurysm)   . PAD (peripheral artery disease)   . Renal cell carcinoma 1995    Kidney removal  . Tobacco abuse 10/27/2012    ROS: [x]  Positive   [ ]  Denies    General: [ ]  Weight loss, [ ]  Fever, [ ]  chills Neurologic: [ ]  Dizziness, [ ]  Blackouts, [ ]  Seizure [ ]  Stroke, [ ]  "Mini stroke", [ ]  Slurred speech, [ ]  Temporary blindness; [ ]  weakness in arms or legs, [ ]  Hoarseness Cardiac: [ ]  Chest pain/pressure, [ ]  Shortness of breath at rest [ ]  Shortness of breath with exertion, [ ]  Atrial fibrillation or irregular heartbeat Vascular: [ ]  Pain in legs with walking, [ ]  Pain in legs at rest, [ ]  Pain in legs at night,  [ ]  Non-healing ulcer, [ ]  Blood clot in vein/DVT,   Pulmonary: [ ]  Home oxygen, [ ]  Productive cough, [ ]  Coughing up blood, [ ]  Asthma,  [ ]  Wheezing Musculoskeletal:  [ ]  Arthritis, [ ]  Low back pain, [ ]   Joint pain Hematologic: [ ]  Easy Bruising, [ ]  Anemia; [ ]  Hepatitis Gastrointestinal: [ ]  Blood in stool, [ ]  Gastroesophageal Reflux/heartburn, [ ]  Trouble swallowing Urinary: [ ]  chronic Kidney disease, [ ]  on HD - [ ]  MWF or [ ]  TTHS, [ ]  Burning with urination, [ ]  Difficulty urinating Skin: [ ]  Rashes, [ ]  Wounds Psychological: [ ]  Anxiety, [ ]  Depression   Social History History  Substance Use Topics  . Smoking status: Current Every Day Smoker -- 0.5 packs/day for 65 years    Types: Cigarettes  . Smokeless tobacco: Not on file  . Alcohol Use: No    Family History Family History  Problem Relation Age of Onset  . Heart attack Father   . Hypertension Father   . Heart disease Father   . Cancer Mother     MOUTH,BREAST,SPINE  . Hypertension Mother   . Diabetes Daughter   . Hypertension Daughter     No Known Allergies  Current Outpatient Prescriptions  Medication Sig Dispense Refill  . amLODipine (NORVASC) 5 MG tablet Take 5 mg by mouth daily.        Marland Kitchen aspirin 81 MG tablet Take 81 mg by mouth daily.        . beta carotene w/minerals (OCUVITE) tablet Take 1 tablet by mouth daily.        Marland Kitchen  brimonidine (ALPHAGAN) 0.15 % ophthalmic solution 1 drop daily.        . brinzolamide (AZOPT) 1 % ophthalmic suspension 1 drop daily.        Marland Kitchen FLUZONE PRESERVATIVE FREE injection       . metoprolol tartrate (LOPRESSOR) 25 MG tablet TAKE ONE TABLET (25 MG TOTAL) BY MOUTH DAILY.  30 tablet  6  . rosuvastatin (CRESTOR) 20 MG tablet Take 20 mg by mouth daily.        . sodium chloride 0.9 % SOLN 1,000 mL with calcium chloride 10 % SOLN 8 g Inject 8 g into the vein as needed. YEARLY (SEPTEMBER)       . bimatoprost (LUMIGAN) 0.03 % ophthalmic drops 1 drop at bedtime.        . ergocalciferol (VITAMIN D2) 50000 UNITS capsule Take 50,000 Units by mouth once a week.        . Fluticasone-Salmeterol (ADVAIR HFA IN) Inhale into the lungs as needed.        Marland Kitchen PROAIR HFA 108 (90 BASE) MCG/ACT inhaler 2  Inhalers Ad lib.      . simvastatin (ZOCOR) 40 MG tablet Take 40 mg by mouth at bedtime.          Physical Examination  Filed Vitals:   11/28/12 0927  BP: 134/85  Pulse: 68  Resp: 14    Body mass index is 16.50 kg/(m^2).  General:  WDWN in NAD Gait: Normal HEENT: WNL Eyes: Pupils equal Pulmonary: normal non-labored breathing , without Rales, rhonchi,  wheezing Cardiac: RRR, without  Murmurs, rubs or gallops; Abdomen: soft, NT, no masses Skin: no rashes, ulcers noted  Vascular Exam Pulses: Palpable femoral and lower extremity pulses bilaterally, there is a palpable also call abdominal mass Carotid bruits: Carotid pulses to auscultation no bruits are heard Extremities without ischemic changes, no Gangrene , no cellulitis; no open wounds;  Musculoskeletal: no muscle wasting or atrophy   Neurologic: A&O X 3; Appropriate Affect ; SENSATION: normal; MOTOR FUNCTION:  moving all extremities equally. Speech is fluent/normal  Non-Invasive Vascular Imaging AAA duplex today shows a maximum diameter of 4.16 AP by 4.12 transverse, previous aortic diameter was 4.47 in June 2013, there is heterogeneous plaque throughout the aorta as well as vessel tortuosity reviewed right CIA aneurysmal dilatation is 2.32 x 2.50, left CIA artery is within normal parameters    ASSESSMENT/PLAN: Asymptomatic patient with moderately stable AAA. The patient will followup in 6 months with repeat duplex do to the difference in measurement 6 months ago and today. The patient's questions were encouraged and answered, she is in agreement with this plan.  Lauree Chandler ANP   Clinic MD: Hart Rochester

## 2012-11-29 NOTE — Addendum Note (Signed)
Addended by: Sharee Pimple on: 11/29/2012 09:52 AM   Modules accepted: Orders

## 2012-11-30 ENCOUNTER — Other Ambulatory Visit: Payer: Self-pay | Admitting: Cardiology

## 2012-11-30 DIAGNOSIS — I10 Essential (primary) hypertension: Secondary | ICD-10-CM

## 2012-11-30 MED ORDER — METOPROLOL TARTRATE 25 MG PO TABS: 25.0000 mg | ORAL_TABLET | Freq: Every day | ORAL | Status: DC

## 2013-05-03 ENCOUNTER — Encounter: Payer: Self-pay | Admitting: Cardiology

## 2013-05-03 ENCOUNTER — Ambulatory Visit (INDEPENDENT_AMBULATORY_CARE_PROVIDER_SITE_OTHER): Payer: Medicare Other | Admitting: Cardiology

## 2013-05-03 VITALS — BP 122/66 | HR 76 | Ht 62.0 in | Wt 91.0 lb

## 2013-05-03 DIAGNOSIS — Z720 Tobacco use: Secondary | ICD-10-CM

## 2013-05-03 DIAGNOSIS — I10 Essential (primary) hypertension: Secondary | ICD-10-CM

## 2013-05-03 DIAGNOSIS — I739 Peripheral vascular disease, unspecified: Secondary | ICD-10-CM

## 2013-05-03 DIAGNOSIS — F17200 Nicotine dependence, unspecified, uncomplicated: Secondary | ICD-10-CM

## 2013-05-03 DIAGNOSIS — I714 Abdominal aortic aneurysm, without rupture: Secondary | ICD-10-CM

## 2013-05-03 MED ORDER — AMLODIPINE BESYLATE 5 MG PO TABS: 5.0000 mg | ORAL_TABLET | Freq: Every day | ORAL | Status: DC

## 2013-05-03 MED ORDER — ROSUVASTATIN CALCIUM 20 MG PO TABS: 20.0000 mg | ORAL_TABLET | Freq: Every day | ORAL | Status: DC

## 2013-05-03 MED ORDER — METOPROLOL TARTRATE 25 MG PO TABS: 25.0000 mg | ORAL_TABLET | Freq: Every day | ORAL | Status: DC

## 2013-05-03 NOTE — Progress Notes (Signed)
[name] Date of Birth: 21-Dec-1927   History of Present Illness: [name] is seen today for followup. She is doing very well from a cardiac standpoint. Her last abdominal ultrasound showed a maximal diameter of 4.14 cm for her abdominal aortic aneurysm. She denies any abdominal or back pain. She's had no chest pain or palpitations. Her breathing is doing fairly well on her inhaler. She does continue to smoke.  Current Outpatient Prescriptions on File Prior to Visit  Medication Sig Dispense Refill  . aspirin 81 MG tablet Take 81 mg by mouth daily.        . beta carotene w/minerals (OCUVITE) tablet Take 1 tablet by mouth daily.        . bimatoprost (LUMIGAN) 0.03 % ophthalmic drops 1 drop at bedtime.        . brimonidine (ALPHAGAN) 0.15 % ophthalmic solution 1 drop daily.        . brinzolamide (AZOPT) 1 % ophthalmic suspension 1 drop daily.        . ergocalciferol (VITAMIN D2) 50000 UNITS capsule Take 50,000 Units by mouth once a week.        . Fluticasone-Salmeterol (ADVAIR HFA IN) Inhale into the lungs as needed.        Marland Kitchen FLUZONE PRESERVATIVE FREE injection       . PROAIR HFA 108 (90 BASE) MCG/ACT inhaler 2 Inhalers Ad lib.      . sodium chloride 0.9 % SOLN 1,000 mL with calcium chloride 10 % SOLN 8 g Inject 8 g into the vein as needed. YEARLY (SEPTEMBER)        No current facility-administered medications on file prior to visit.    No Known Allergies  Past Medical History  Diagnosis Date  . Aortic atherosclerosis   . PAD (peripheral artery disease)   . COPD (chronic obstructive pulmonary disease)   . HTN (hypertension)   . Hypercholesteremia   . Glaucoma(365)   . Bronchitis   . Fractured coccyx   . AAA (abdominal aortic aneurysm)   . PAD (peripheral artery disease)   . Renal cell carcinoma 1995    Kidney removal  . Tobacco abuse 10/27/2012    Past Surgical History  Procedure Laterality Date  . Nephrectomy      LEFT/CANCER  . Renal cryoablation      Right  with radiation  . Hip fracture surgery  2009    History  Smoking status  . Current Every Day Smoker -- 0.50 packs/day for 65 years  . Types: Cigarettes  Smokeless tobacco  . Not on file    History  Alcohol Use No    Family History  Problem Relation Age of Onset  . Heart attack Father   . Hypertension Father   . Heart disease Father   . Cancer Mother     MOUTH,BREAST,SPINE  . Hypertension Mother   . Diabetes Daughter   . Hypertension Daughter     Review of Systems: As noted in history of present illness. She does have declining vision related to glaucoma. She no longer drives. All other systems were reviewed and are negative.  Physical Exam: BP 122/66  Pulse 76  Ht 5\' 2"  (1.575 m)  Wt 91 lb (41.277 kg)  BMI 16.64 kg/m2  SpO2 94% She is a thin elderly white female in no acute distress. HEENT exam is unremarkable. She has no JVD or bruits. Lungs are clear. Cardiac exam is without gallop, murmur, or click. Abdomen is soft and nontender.  She has no lower extremity edema. She has  poor pedal pulses. Her skin is thin and atrophic. She is alert and oriented x3. Cranial nerves II through XII are intact.  LABORATORY DATA:   Assessment / Plan: 1. Abdominal aortic aneurysm. Size is stable on last exam. Repeat ultrasound every 6 months. I told her the best way to reduce her risk was to quit smoking.  2. Hypertension, well controlled.  3. PAD with stable claudication  4. Tobacco abuse. Counseled patient on smoking cessation. She is not ready to quit.

## 2013-05-03 NOTE — Patient Instructions (Signed)
Quit smoking!  Continue your other treatment.

## 2013-05-24 ENCOUNTER — Other Ambulatory Visit (HOSPITAL_COMMUNITY): Payer: Self-pay | Admitting: Interventional Radiology

## 2013-05-24 DIAGNOSIS — R634 Abnormal weight loss: Secondary | ICD-10-CM

## 2013-05-24 DIAGNOSIS — C641 Malignant neoplasm of right kidney, except renal pelvis: Secondary | ICD-10-CM

## 2013-05-26 ENCOUNTER — Other Ambulatory Visit: Payer: Self-pay | Admitting: Cardiology

## 2013-05-29 ENCOUNTER — Ambulatory Visit: Payer: Medicare Other | Admitting: Vascular Surgery

## 2013-05-29 ENCOUNTER — Ambulatory Visit: Payer: Medicare Other | Admitting: Neurosurgery

## 2013-05-29 ENCOUNTER — Encounter (INDEPENDENT_AMBULATORY_CARE_PROVIDER_SITE_OTHER): Payer: Medicare Other | Admitting: *Deleted

## 2013-05-29 DIAGNOSIS — I714 Abdominal aortic aneurysm, without rupture: Secondary | ICD-10-CM

## 2013-06-01 ENCOUNTER — Other Ambulatory Visit: Payer: Self-pay

## 2013-06-01 DIAGNOSIS — I714 Abdominal aortic aneurysm, without rupture: Secondary | ICD-10-CM

## 2013-06-06 ENCOUNTER — Encounter: Payer: Self-pay | Admitting: Vascular Surgery

## 2013-06-14 ENCOUNTER — Other Ambulatory Visit: Payer: Self-pay | Admitting: Radiology

## 2013-06-14 DIAGNOSIS — N2889 Other specified disorders of kidney and ureter: Secondary | ICD-10-CM

## 2013-06-27 LAB — CREATININE WITH EST GFR: GFR, Est Non African American: 83 mL/min

## 2013-06-27 LAB — BUN: BUN: 11 mg/dL (ref 6–23)

## 2013-07-04 ENCOUNTER — Other Ambulatory Visit: Payer: Medicare Other

## 2013-07-04 ENCOUNTER — Ambulatory Visit (HOSPITAL_COMMUNITY): Payer: Medicare Other

## 2013-07-18 ENCOUNTER — Ambulatory Visit
Admission: RE | Admit: 2013-07-18 | Discharge: 2013-07-18 | Disposition: A | Discharge status: Home / Self Care | Payer: Medicare Other | Source: Ambulatory Visit | Attending: Interventional Radiology | Admitting: Interventional Radiology

## 2013-07-18 ENCOUNTER — Other Ambulatory Visit (HOSPITAL_COMMUNITY): Payer: Self-pay | Admitting: Interventional Radiology

## 2013-07-18 ENCOUNTER — Ambulatory Visit (HOSPITAL_COMMUNITY)
Admission: RE | Admit: 2013-07-18 | Discharge: 2013-07-18 | Disposition: A | Discharge status: Home / Self Care | Payer: Medicare Other | Source: Ambulatory Visit | Attending: Interventional Radiology | Admitting: Interventional Radiology

## 2013-07-18 DIAGNOSIS — C641 Malignant neoplasm of right kidney, except renal pelvis: Secondary | ICD-10-CM

## 2013-07-18 DIAGNOSIS — R634 Abnormal weight loss: Secondary | ICD-10-CM

## 2013-07-18 DIAGNOSIS — I714 Abdominal aortic aneurysm, without rupture, unspecified: Secondary | ICD-10-CM | POA: Insufficient documentation

## 2013-07-18 DIAGNOSIS — I700 Atherosclerosis of aorta: Secondary | ICD-10-CM | POA: Insufficient documentation

## 2013-07-18 DIAGNOSIS — Z905 Acquired absence of kidney: Secondary | ICD-10-CM | POA: Insufficient documentation

## 2013-07-18 DIAGNOSIS — M899 Disorder of bone, unspecified: Secondary | ICD-10-CM | POA: Insufficient documentation

## 2013-07-18 DIAGNOSIS — Z85528 Personal history of other malignant neoplasm of kidney: Secondary | ICD-10-CM | POA: Insufficient documentation

## 2013-07-18 DIAGNOSIS — N281 Cyst of kidney, acquired: Secondary | ICD-10-CM | POA: Insufficient documentation

## 2013-07-18 DIAGNOSIS — M949 Disorder of cartilage, unspecified: Secondary | ICD-10-CM | POA: Insufficient documentation

## 2013-07-18 DIAGNOSIS — K5730 Diverticulosis of large intestine without perforation or abscess without bleeding: Secondary | ICD-10-CM | POA: Insufficient documentation

## 2013-07-18 DIAGNOSIS — N8111 Cystocele, midline: Secondary | ICD-10-CM | POA: Insufficient documentation

## 2013-07-18 DIAGNOSIS — Z09 Encounter for follow-up examination after completed treatment for conditions other than malignant neoplasm: Secondary | ICD-10-CM | POA: Insufficient documentation

## 2013-07-18 MED ORDER — IOHEXOL 300 MG/ML  SOLN
80.0000 mL | Freq: Once | INTRAMUSCULAR | Status: AC | PRN
  Administered 2013-07-18: 80 mL via INTRAVENOUS

## 2013-07-18 NOTE — Progress Notes (Signed)
Patient ID: [name], female   DOB: [birth year], 79 y.o.   MRN: [id number]  ESTABLISHED PATIENT OFFICE VISIT  Chief Complaint: Status post radiofrequency ablation of a right renal cell carcinoma in November, 2009 followed by cryoablation of a recurrence on 05/08/2010.  History: Mrs. Fauteux states that she has been losing weight. Dr. Waynard Edwards has diagnosed her with hyperthyroidism and she is currently being treated. She denies any other complaints.  Review of Systems: No fever or chills. No abdominal or flank pain. No hematuria or dysuria. No nausea, vomiting or diarrhea.  Exam: Vital signs: Blood pressure 148/79, pulse 85, respirations 18, temperature 97.6, oxygen saturation 98% on room air. General: No acute distress. Abdomen: Soft and nontender. No flank tenderness.  Labs: BUN 11, creatinine 0.64 and estimated GFR 83 ml/minute on 06/27/2013.  Imaging: Follow-up CT of the abdomen and pelvis was performed earlier today. This shows a stable ablation defect of the right kidney. No convincing abnormal enhancement is seen to suggest tumor recurrence. The abdominal aortic aneurysm is stable to minimally increased in size. Multifocal areas of nodular hyperenhancement in the pancreas are identified. 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 There is no evidence of metastatic disease in the abdomen or pelvis.  Assessment and Plan: No evidence of recurrent right renal carcinoma. Areas of hyperenhancement were identified by CT today in the pancreas. 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 I told the patient that I would notify Dr. Waynard Edwards of these findings to see if any further workup may be needed.

## 2013-07-18 NOTE — Progress Notes (Signed)
Denies hematuria or other problems with urination.  Denies discomfort associated with procedure.  Able to complete ADL's without assistance.  States that her husband does the cooking.  Linet Brash Carmell Austria, RN 07/18/2013 1:59 PM

## 2013-07-27 ENCOUNTER — Encounter (HOSPITAL_COMMUNITY): Payer: Medicare Other

## 2013-07-30 ENCOUNTER — Other Ambulatory Visit (HOSPITAL_COMMUNITY): Payer: Self-pay

## 2013-07-30 ENCOUNTER — Other Ambulatory Visit (HOSPITAL_COMMUNITY): Payer: Self-pay | Admitting: Internal Medicine

## 2013-08-07 ENCOUNTER — Encounter (HOSPITAL_COMMUNITY): Payer: Self-pay

## 2013-08-07 ENCOUNTER — Ambulatory Visit (HOSPITAL_COMMUNITY)
Admission: RE | Admit: 2013-08-07 | Discharge: 2013-08-07 | Disposition: A | Discharge status: Home / Self Care | Payer: Medicare Other | Source: Ambulatory Visit | Attending: Internal Medicine | Admitting: Internal Medicine

## 2013-08-07 DIAGNOSIS — M810 Age-related osteoporosis without current pathological fracture: Secondary | ICD-10-CM | POA: Insufficient documentation

## 2013-08-07 MED ORDER — SODIUM CHLORIDE 0.9 % IV SOLN
250.0000 mL | Freq: Once | INTRAVENOUS | Status: AC
  Administered 2013-08-07: 250 mL via INTRAVENOUS

## 2013-08-07 MED ORDER — ZOLEDRONIC ACID 5 MG/100ML IV SOLN
5.0000 mg | Freq: Once | INTRAVENOUS | Status: AC
  Administered 2013-08-07: 5 mg via INTRAVENOUS
  Filled 2013-08-07: qty 100

## 2013-08-14 ENCOUNTER — Other Ambulatory Visit: Payer: Self-pay | Admitting: Otolaryngology

## 2013-08-14 DIAGNOSIS — Z8521 Personal history of malignant neoplasm of larynx: Secondary | ICD-10-CM

## 2013-08-14 DIAGNOSIS — R1310 Dysphagia, unspecified: Secondary | ICD-10-CM

## 2013-08-14 DIAGNOSIS — R490 Dysphonia: Secondary | ICD-10-CM

## 2013-08-22 ENCOUNTER — Ambulatory Visit
Admission: RE | Admit: 2013-08-22 | Discharge: 2013-08-22 | Disposition: A | Discharge status: Home / Self Care | Payer: Medicare Other | Source: Ambulatory Visit | Attending: Otolaryngology | Admitting: Otolaryngology

## 2013-08-22 DIAGNOSIS — Z8521 Personal history of malignant neoplasm of larynx: Secondary | ICD-10-CM

## 2013-08-22 DIAGNOSIS — R1310 Dysphagia, unspecified: Secondary | ICD-10-CM

## 2013-08-22 DIAGNOSIS — R490 Dysphonia: Secondary | ICD-10-CM

## 2013-08-22 MED ORDER — IOHEXOL 300 MG/ML  SOLN
75.0000 mL | Freq: Once | INTRAMUSCULAR | Status: AC | PRN
  Administered 2013-08-22: 75 mL via INTRAVENOUS

## 2013-08-27 ENCOUNTER — Other Ambulatory Visit: Payer: Self-pay | Admitting: Otolaryngology

## 2013-08-28 ENCOUNTER — Telehealth: Payer: Self-pay | Admitting: Hematology and Oncology

## 2013-08-28 NOTE — Telephone Encounter (Signed)
C/D 08/28/13 for appt. 08/30/13

## 2013-08-30 ENCOUNTER — Ambulatory Visit (HOSPITAL_BASED_OUTPATIENT_CLINIC_OR_DEPARTMENT_OTHER): Payer: Medicare Other | Admitting: Hematology and Oncology

## 2013-08-30 ENCOUNTER — Encounter: Payer: Medicare Other | Admitting: Nutrition

## 2013-08-30 ENCOUNTER — Encounter: Payer: Self-pay | Admitting: Hematology and Oncology

## 2013-08-30 ENCOUNTER — Ambulatory Visit: Payer: Medicare Other

## 2013-08-30 ENCOUNTER — Encounter: Payer: Self-pay | Admitting: *Deleted

## 2013-08-30 ENCOUNTER — Telehealth: Payer: Self-pay | Admitting: Hematology and Oncology

## 2013-08-30 ENCOUNTER — Other Ambulatory Visit: Payer: Self-pay | Admitting: Otolaryngology

## 2013-08-30 VITALS — BP 153/72 | HR 77 | Temp 97.7°F | Resp 20 | Ht 62.0 in | Wt 87.2 lb

## 2013-08-30 DIAGNOSIS — C801 Malignant (primary) neoplasm, unspecified: Secondary | ICD-10-CM

## 2013-08-30 DIAGNOSIS — R634 Abnormal weight loss: Secondary | ICD-10-CM

## 2013-08-30 DIAGNOSIS — C642 Malignant neoplasm of left kidney, except renal pelvis: Secondary | ICD-10-CM

## 2013-08-30 DIAGNOSIS — R599 Enlarged lymph nodes, unspecified: Secondary | ICD-10-CM

## 2013-08-30 DIAGNOSIS — R918 Other nonspecific abnormal finding of lung field: Secondary | ICD-10-CM

## 2013-08-30 DIAGNOSIS — F17200 Nicotine dependence, unspecified, uncomplicated: Secondary | ICD-10-CM

## 2013-08-30 DIAGNOSIS — M899 Disorder of bone, unspecified: Secondary | ICD-10-CM

## 2013-08-30 DIAGNOSIS — R1310 Dysphagia, unspecified: Secondary | ICD-10-CM

## 2013-08-30 DIAGNOSIS — C329 Malignant neoplasm of larynx, unspecified: Secondary | ICD-10-CM

## 2013-08-30 NOTE — Progress Notes (Signed)
[facility] OFFICE CONSULT NOTE  Victoria Kayser, MD [address] [address] No chief complaint on file.   DIAGNOSIS: Laryngeal mass, history of kidney cancer status post nephrectomy, bone mass  SUMMARY OF ONCOLOGIC HISTORY: I reviewed the records extensively and collaborated the history with the patient and her husband. Approximately 20 years ago, she underwent a left nephrectomy after presentation with weight loss. She never received adjuvant treatment for this. She had disease recurrence in the contralateral kidney which she has underwent multiple ablation therapy to the right kidney, a loss treatment was approximately 4 years ago. She never followup with a medical oncologist.  INTERVAL HISTORY:    [name] 79 y.o. female is accompanied by her husband today. According to the patient, she had progressive dysphagia for approximately 3 months. Due to the difficulty swallowing foods, she has lost approximately 10 pounds of weight. The dysphagia is mainly to solids but not to liquids. She also has choking sensation on a regular basis. She had mild change in the quality of her voice. She went to see her primary care provider for further evaluation. On 08/22/2013, she underwent CT scan of the neck which show a bulky mass in the left puriform sinus with suprapubic extension to the hiatal bone level with inferior extension just above the level. This also a suspicious lymph node on the left side. Incidentally, this also a suspicious lesion on the right upper lobe. She was referred to Dr. Annalee Genta for further evaluation. On 08/27/2013, she had laryngoscope be in biopsy performed. According to the operative report, there is erosive, bulky in, mucosal lesion involving the epiglottic fold and piriform sinus worrisome for cancer. Unfortunately biopsy is inconclusive for squamous cell carcinoma but the show evidence of dysplasia. She's been referred here for further  management. She denies any hearing difficulties. Denies any palpable mass in her neck. The major risk factor for cancer include more than 50 pack year history of smoking. The patient apparently declined nicotine cessation efforts. She denies excessive alcohol intake.  MEDICAL HISTORY:  Past Medical History  Diagnosis Date  . Aortic atherosclerosis   . PAD (peripheral artery disease)   . COPD (chronic obstructive pulmonary disease)   . HTN (hypertension)   . Hypercholesteremia   . Glaucoma   . Bronchitis   . Fractured coccyx   . AAA (abdominal aortic aneurysm)   . PAD (peripheral artery disease)   . Renal cell carcinoma 1995    Kidney removal  . Tobacco abuse 10/27/2012    SURGICAL HISTORY: Past Surgical History  Procedure Laterality Date  . Nephrectomy      LEFT/CANCER  . Renal cryoablation      Right with radiation  . Hip fracture surgery  2009    SOCIAL HISTORY: History   Social History  . Marital Status: Married    Spouse Name: N/A    Number of Children: 3  . Years of Education: N/A   Occupational History  . homemaker     retired   Social History Main Topics  . Smoking status: Current Every Day Smoker -- 0.50 packs/day for 65 years    Types: Cigarettes  . Smokeless tobacco: Not on file  . Alcohol Use: No  . Drug Use: No  . Sexual Activity: Not on file   Other Topics Concern  . Not on file   Social History Narrative  . No narrative on file    FAMILY HISTORY: Family History  Problem Relation Age  of Onset  . Heart attack Father   . Hypertension Father   . Heart disease Father   . Hypertension Mother   . Cancer Mother     MOUTH,BREAST,SPINE  . Diabetes Daughter   . Hypertension Daughter     ALLERGIES:  has No Known Allergies.  MEDICATIONS: [name] had no medications administered during this visit.    REVIEW OF SYSTEMS:   Constitutional: Denies fevers, chills. She has significant abnormal weight loss Eyes: Denies blurriness of vision.  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  PHYSICAL EXAMINATION: ECOG PERFORMANCE STATUS: 1 - Symptomatic but completely ambulatory  Filed Vitals:   08/30/13 1432  BP: 153/72  Pulse: 77  Temp: 97.7 F (36.5 C)  Resp: 20    GENERAL:alert, no distress and comfortable. Patient appeared to be thin and cachectic SKIN: skin color, texture, turgor are normal, no rashes or significant lesions EYES: normal, Conjunctiva are pink and non-injected, sclera clear OROPHARYNX:no exudate, no erythema and lips, buccal mucosa, and tongue normal  NECK: supple, thyroid normal size, non-tender, without nodularity LYMPH:  no palpable lymphadenopathy in the cervical, axillary or inguinal LUNGS: clear to auscultation and percussion with normal breathing effort HEART: regular rate & rhythm and no murmurs and no lower extremity edema ABDOMEN:abdomen soft, non-tender and normal bowel sounds Musculoskeletal:no cyanosis of digits and no clubbing  NEURO: alert & oriented x 3 with fluent speech, no focal motor/sensory deficits  LABORATORY DATA:  I have reviewed the data as listed Recent Results (from the past 2160 hour(s))  BUN     Status: None   Collection Time    06/27/13 11:38 AM      Result Value Range   BUN 11  6 - 23 mg/dL  CREATININE WITH EST GFR     Status: None   Collection Time    06/27/13 11:38 AM      Result Value Range   Creat 0.64  0.50 - 1.10 mg/dL   GFR,  Est African American >89     GFR, Est Non African American 83     Comment:       The estimated GFR is a calculation valid for adults (>=58 years old)     that uses the CKD-EPI algorithm to adjust for age and sex. It is       not to be used for children, pregnant women, hospitalized patients,        patients on dialysis, or with rapidly changing kidney function.     According to the NKDEP, eGFR >89 is normal, 60-89 shows mild     impairment, 30-59 shows moderate impairment, 15-29 shows severe     impairment and <15 is ESRD.           RADIOGRAPHIC STUDIES: I have personally reviewed the radiological images as listed and agreed with the findings in the report. Ct Soft Tissue Neck W Contrast  08/22/2013   *RADIOLOGY REPORT*  Clinical Data: Dysphagia.  Hoarseness.  History of hypopharyngeal neoplasm.  Prior surgery and radiation therapy.  History of  kidney cancer.  BUN and creatinine were obtained on site at [facility] at [address] Results:  BUN 7 mg/dL,  Creatinine 0.9 mg/dL.  CT NECK WITH CONTRAST  Technique:  Multidetector CT imaging of the neck was performed with intravenous contrast.  Contrast: 75mL OMNIPAQUE IOHEXOL 300 MG/ML  SOLN  Comparison: Cervical spine CT 04/07/2008.  Findings: Bulky mass centered at the left piriform sinus with superior extension to hyoid bone level with inferior extension just above the glottic level.  Findings highly suspicious for tumor.  Largest lymph node left level II region measures 1.1 x 0.9 cm transverse dimension spanning over 1.8 cm.  Heterogeneous thyroid gland containing multiple small lesions and calcifications can be followed with thyroid ultrasound.  Prominent atherosclerotic type changes of all visualized arterial structures including aortic arch and great vessels as well as carotid bifurcation.  The left internal carotid artery is occluded. 80% diameter stenosis proximal right internal carotid artery.  Right upper lobe ill-defined  lesions new from 2007 chest CT. Dedicated chest CT can be obtained for further delineation if clinically desired.  Cervical spondylotic changes most notable C4-5 and C5-6.  No bony destructive lesion.  IMPRESSION: Bulky mass centered at the left piriform sinus with superior extension to hyoid bone level with inferior extension just above the glottic level.  Findings highly suspicious for tumor.  Largest lymph node left level II region measures 1.1 x 0.9 cm transverse dimension spanning over 1.8 cm.  Right upper lobe ill-defined lesions new from 2007 chest CT. Dedicated chest CT can be obtained for further delineation if clinically desired.  The left internal carotid artery is occluded.  80% diameter stenosis proximal right internal carotid artery.  Heterogeneous thyroid gland containing multiple small lesions and calcifications can be followed with thyroid ultrasound.   Original Report Authenticated By: Lacy Duverney, M.D.    ASSESSMENT: Laryngeal cancer until proven otherwise with possible regional lymph node involvement on the left side of her neck based on the imaging study but not clinically palpable, and abnormal imaging study of her chest worrisome for possible metastatic disease.   PLAN:  #1 possible laryngeal cancer, clinical T2, N1, MX #2 possible lung mass #3 history of nephrectomy for kidney cancer #4 nicotine dependence E. #5 significant weight loss #6 significant dysphagia I had a long discussion with the patient and her husband. I would like to pursue a PET/CT scan as soon as possible for staging. Dr. Artis Flock has decided to repeat another biopsy in the near future. Imaging study indicated diffuse metastatic disease, which could potentially obtain biopsy elsewhere to confirm. If she proves to have lung involvement, we might also need to a certain whether the metastatic disease is due to the laryngeal disease, versus metastatic kidney cancer, versus possible new lung primary given his  significant smoking history. She is currently not interested to quit smoking. I recommended she increase oral intake and nutritional supplements. On the return visit we will get our nutritionist to see the patient for advice. We will also get a speech and language therapist to assess her swallowing. I will see the patient back within the next 10 days to follow up on all these test results. Addressed all questions and concerns.   All questions were answered. The patient knows to call the clinic with any problems, questions or concerns. We can certainly see the patient much sooner if necessary. No barriers to learning was detected.  The patient and plan discussed with [name], [name] and she is  in agreement with the aforementioned.  I spent 55 minutes counseling the patient face to face. The total time spent in the appointment was 60 minutes and more than 50% was on counseling.     [name], Victoria Hausman, MD 08/30/2013 10:19 PM

## 2013-08-30 NOTE — Progress Notes (Signed)
Checked in new pt with no financial issues.  °

## 2013-08-30 NOTE — Telephone Encounter (Signed)
Gave pt appt for MD,speech and nutition for 09/03/13

## 2013-08-31 ENCOUNTER — Telehealth: Payer: Self-pay | Admitting: Hematology and Oncology

## 2013-08-31 NOTE — Progress Notes (Signed)
Met with pt and her husband during New Consult appt with Dr. Bertis Ruddy.  Introduced myself and explained my role as her Navigator.  Will navigate as L1 (new) patient.  Young Berry, RN, BSN, [facility] Navigator [phone]

## 2013-09-03 ENCOUNTER — Ambulatory Visit: Payer: Medicare Other

## 2013-09-03 ENCOUNTER — Ambulatory Visit: Payer: Medicare Other | Admitting: Nutrition

## 2013-09-03 NOTE — Addendum Note (Signed)
Addended by: Alisyn Lequire on: 09/03/2013 05:45 PM   Modules accepted: Orders  

## 2013-09-03 NOTE — Progress Notes (Signed)
This is an 80 year old female diagnosed with laryngeal cancer.  She is a patient of Dr. Bertis Ruddy.  Medical history includes current tobacco usage, PAD, COPD, hypertension, increased cholesterol, AAA, renal cell cancer status post nephrectomy, and hypothyroidism.  Medications include Crestor and vitamin D.  Labs were reviewed.  Height: 62 inches. Weight: 87.2 pounds. Usual body weight: 91 pounds.  May 03, 2013. BMI of 15.95.  Patient reports progressive dysphagia for about 3 months, especially of solid foods.  She reports a 10 pound weight loss.  She states she feels she is eating better now.  She drinks one chocolate ensure daily.  Patient lives at home with husband, who does most of the cooking.  Patient points out a wound on her foot.  She reports her husband stepped on her foot.  She is using Neosporin on the wound and icing her ankle which is swollen.  Patient educated by staff to see primary MD for evaluation.  Nutrition diagnosis: Overweight related to dysphasia and inadequate oral intake as evidenced by BMI of 15.95.  Intervention: Patient was educated to increase calories and protein in small amounts throughout the day.  I've encouraged patient to consume Ensure Plus or boost plus twice a day between meals.  I provided fact sheets and contact information.  I've also given patient samples of high-calorie nutrition supplements.  Teach back method used.  Monitoring, evaluation, goals: Patient will tolerate increased oral intake to promote weight gain.  Next visit: I will monitor patient's treatment schedule and followup as needed once treatment begins.

## 2013-09-04 ENCOUNTER — Telehealth: Payer: Self-pay | Admitting: Dietician

## 2013-09-05 ENCOUNTER — Encounter (HOSPITAL_COMMUNITY): Payer: Self-pay | Admitting: Pharmacy Technician

## 2013-09-05 ENCOUNTER — Encounter (HOSPITAL_COMMUNITY)
Admission: RE | Admit: 2013-09-05 | Discharge: 2013-09-05 | Disposition: A | Discharge status: Home / Self Care | Payer: Medicare Other | Source: Ambulatory Visit | Attending: Otolaryngology | Admitting: Otolaryngology

## 2013-09-05 ENCOUNTER — Encounter (HOSPITAL_COMMUNITY): Payer: Self-pay

## 2013-09-05 ENCOUNTER — Other Ambulatory Visit (HOSPITAL_COMMUNITY): Payer: Self-pay | Admitting: *Deleted

## 2013-09-05 LAB — BASIC METABOLIC PANEL
BUN: 11 mg/dL (ref 6–23)
Creatinine, Ser: 0.67 mg/dL (ref 0.50–1.10)
GFR calc Af Amer: >90 mL/min (ref >90)
GFR calc non Af Amer: 80 mL/min — ABNORMAL LOW (ref >90)

## 2013-09-05 LAB — CBC
MCHC: 33.3 g/dL (ref 30.0–36.0)
Platelets: 362 10*3/uL (ref 150–400)
RBC: 4.6 MIL/uL (ref 3.87–5.11)
RDW: 13.7 % (ref 11.5–15.5)

## 2013-09-05 NOTE — Pre-Procedure Instructions (Signed)
[name]  09/05/2013   Your procedure is scheduled on:  September 07, 2013 at 9:00 AM   Report to [facility] Main Entrance "A" at 7:00 AM.   Call this number if you have problems the morning of surgery: [phone]   Remember:   Do not eat food or drink liquids after midnight Thursday, 09/06/13    Take these medicines the morning of surgery with A SIP OF WATER: amLODipine (NORVASC), metoprolol tartrate (LOPRESSOR), Fluticasone-Salmeterol (ADVAIR HFA IN) - if needed  Stop all Vitamins, Aspirin, Herbal medications as of today, 09/05/13      Do not wear jewelry, make-up or nail polish.  Do not wear lotions, powders, or perfumes. You may wear deodorant.  Do not shave 48 hours prior to surgery. Men may shave face and neck.  Do not bring valuables to the hospital.  [facility] is not responsible                  for any belongings or valuables.               Contacts, dentures or bridgework may not be worn into surgery.  Leave suitcase in the car. After surgery it may be brought to your room.  For patients admitted to the hospital, discharge time is determined by your                treatment team.               Patients discharged the day of surgery will not be allowed to drive  home.  Name and phone number of your driver: Family/friend   Special Instructions: Shower using CHG 2 nights before surgery and the night before surgery.  If you shower the day of surgery use CHG.  Use special wash - you have one bottle of CHG for all showers.  You should use approximately 1/3 of the bottle for each shower.   Please read over the following fact sheets that you were given: Pain Booklet, Coughing and Deep Breathing and Surgical Site Infection Prevention

## 2013-09-06 MED ORDER — CEFAZOLIN SODIUM-DEXTROSE 2-3 GM-% IV SOLR
2.0000 g | INTRAVENOUS | Status: AC
  Administered 2013-09-07: 2 g via INTRAVENOUS
  Filled 2013-09-06: qty 50

## 2013-09-06 MED ORDER — DEXAMETHASONE SODIUM PHOSPHATE 10 MG/ML IJ SOLN
10.0000 mg | Freq: Once | INTRAMUSCULAR | Status: AC
  Administered 2013-09-07: 10 mg via INTRAVENOUS
  Filled 2013-09-06: qty 1

## 2013-09-07 ENCOUNTER — Ambulatory Visit (HOSPITAL_COMMUNITY): Payer: Medicare Other | Admitting: Anesthesiology

## 2013-09-07 ENCOUNTER — Encounter (HOSPITAL_COMMUNITY): Payer: Self-pay | Admitting: Anesthesiology

## 2013-09-07 ENCOUNTER — Encounter (HOSPITAL_COMMUNITY): Payer: Self-pay | Admitting: *Deleted

## 2013-09-07 ENCOUNTER — Ambulatory Visit (HOSPITAL_COMMUNITY)
Admission: RE | Admit: 2013-09-07 | Discharge: 2013-09-07 | Disposition: A | Discharge status: Home / Self Care | Payer: Medicare Other | Source: Ambulatory Visit | Attending: Otolaryngology | Admitting: Otolaryngology

## 2013-09-07 ENCOUNTER — Encounter (HOSPITAL_COMMUNITY)
Admission: RE | Disposition: A | Discharge status: Home / Self Care | Payer: Self-pay | Source: Ambulatory Visit | Attending: Otolaryngology

## 2013-09-07 DIAGNOSIS — C329 Malignant neoplasm of larynx, unspecified: Secondary | ICD-10-CM | POA: Insufficient documentation

## 2013-09-07 DIAGNOSIS — J387 Other diseases of larynx: Secondary | ICD-10-CM

## 2013-09-07 DIAGNOSIS — Z01812 Encounter for preprocedural laboratory examination: Secondary | ICD-10-CM | POA: Insufficient documentation

## 2013-09-07 DIAGNOSIS — Z01818 Encounter for other preprocedural examination: Secondary | ICD-10-CM | POA: Insufficient documentation

## 2013-09-07 DIAGNOSIS — J449 Chronic obstructive pulmonary disease, unspecified: Secondary | ICD-10-CM | POA: Insufficient documentation

## 2013-09-07 DIAGNOSIS — I739 Peripheral vascular disease, unspecified: Secondary | ICD-10-CM | POA: Insufficient documentation

## 2013-09-07 DIAGNOSIS — J4489 Other specified chronic obstructive pulmonary disease: Secondary | ICD-10-CM | POA: Insufficient documentation

## 2013-09-07 DIAGNOSIS — R490 Dysphonia: Secondary | ICD-10-CM | POA: Insufficient documentation

## 2013-09-07 DIAGNOSIS — R1310 Dysphagia, unspecified: Secondary | ICD-10-CM | POA: Insufficient documentation

## 2013-09-07 DIAGNOSIS — E7800 Pure hypercholesterolemia, unspecified: Secondary | ICD-10-CM | POA: Insufficient documentation

## 2013-09-07 DIAGNOSIS — I10 Essential (primary) hypertension: Secondary | ICD-10-CM | POA: Insufficient documentation

## 2013-09-07 HISTORY — PX: DIRECT LARYNGOSCOPY: SHX5326

## 2013-09-07 SURGERY — LARYNGOSCOPY, DIRECT
Anesthesia: General | Site: Neck | Wound class: Clean Contaminated

## 2013-09-07 MED ORDER — EPINEPHRINE HCL 1 MG/ML IJ SOLN
INTRAMUSCULAR | Status: AC
  Filled 2013-09-07: qty 1

## 2013-09-07 MED ORDER — PHENYLEPHRINE HCL 10 MG/ML IJ SOLN
INTRAMUSCULAR | Status: DC | PRN
  Administered 2013-09-07 (×3): 40 ug via INTRAVENOUS

## 2013-09-07 MED ORDER — HYDROCODONE-ACETAMINOPHEN 10-300 MG/15ML PO SOLN: 5.0000 mL | ORAL | Status: DC | PRN

## 2013-09-07 MED ORDER — FENTANYL CITRATE 0.05 MG/ML IJ SOLN
INTRAMUSCULAR | Status: DC | PRN
  Administered 2013-09-07: 25 ug via INTRAVENOUS

## 2013-09-07 MED ORDER — PROPOFOL 10 MG/ML IV BOLUS
INTRAVENOUS | Status: DC | PRN
  Administered 2013-09-07: 100 mg via INTRAVENOUS
  Administered 2013-09-07: 30 mg via INTRAVENOUS
  Administered 2013-09-07: 20 mg via INTRAVENOUS

## 2013-09-07 MED ORDER — FENTANYL CITRATE 0.05 MG/ML IJ SOLN: 25.0000 ug | INTRAMUSCULAR | Status: DC | PRN

## 2013-09-07 MED ORDER — SUCCINYLCHOLINE CHLORIDE 20 MG/ML IJ SOLN
INTRAMUSCULAR | Status: DC | PRN
  Administered 2013-09-07: 60 mg via INTRAVENOUS

## 2013-09-07 MED ORDER — LACTATED RINGERS IV SOLN
INTRAVENOUS | Status: DC
Start: 2013-09-07 — End: 2013-09-07
  Administered 2013-09-07: 07:00:00 via INTRAVENOUS

## 2013-09-07 MED ORDER — LIDOCAINE HCL (CARDIAC) 20 MG/ML IV SOLN
INTRAVENOUS | Status: DC | PRN
  Administered 2013-09-07: 50 mg via INTRAVENOUS

## 2013-09-07 MED ORDER — ONDANSETRON HCL 4 MG/2ML IJ SOLN
INTRAMUSCULAR | Status: DC | PRN
  Administered 2013-09-07: 4 mg via INTRAVENOUS

## 2013-09-07 SURGICAL SUPPLY — 29 items
BALLN PULM 15 16.5 18X75 (BALLOONS)
BALLOON PULM 15 16.5 18X75 (BALLOONS) IMPLANT
CANISTER SUCTION 2500CC (MISCELLANEOUS) ×2 IMPLANT
CLOTH BEACON ORANGE TIMEOUT ST (SAFETY) ×2 IMPLANT
CONT SPEC 4OZ CLIKSEAL STRL BL (MISCELLANEOUS) IMPLANT
COVER TABLE BACK 60X90 (DRAPES) ×2 IMPLANT
DRAPE PROXIMA HALF (DRAPES) ×2 IMPLANT
DRESSING TELFA 8X3 (GAUZE/BANDAGES/DRESSINGS) ×2 IMPLANT
GLOVE BIO SURGEON STRL SZ7.5 (GLOVE) ×2 IMPLANT
GLOVE BIOGEL M 7.0 STRL (GLOVE) ×2 IMPLANT
GLOVE BIOGEL PI IND STRL 7.0 (GLOVE) ×1 IMPLANT
GLOVE BIOGEL PI IND STRL 7.5 (GLOVE) ×1 IMPLANT
GLOVE BIOGEL PI INDICATOR 7.0 (GLOVE) ×1
GLOVE BIOGEL PI INDICATOR 7.5 (GLOVE) ×1
GLOVE SURG SS PI 7.0 STRL IVOR (GLOVE) ×2 IMPLANT
GUARD TEETH (MISCELLANEOUS) IMPLANT
KIT ROOM TURNOVER OR (KITS) ×2 IMPLANT
MARKER SKIN DUAL TIP RULER LAB (MISCELLANEOUS) ×2 IMPLANT
NEEDLE 18GX1X1/2 (RX/OR ONLY) (NEEDLE) ×2 IMPLANT
NS IRRIG 1000ML POUR BTL (IV SOLUTION) ×2 IMPLANT
PAD ARMBOARD 7.5X6 YLW CONV (MISCELLANEOUS) ×4 IMPLANT
PATTIES SURGICAL .5 X3 (DISPOSABLE) IMPLANT
SPECIMEN JAR SMALL (MISCELLANEOUS) ×2 IMPLANT
SPONGE GAUZE 4X4 12PLY (GAUZE/BANDAGES/DRESSINGS) IMPLANT
SURGILUBE 2OZ TUBE FLIPTOP (MISCELLANEOUS) IMPLANT
SYR INFLATE BILIARY GAUGE (MISCELLANEOUS) IMPLANT
TOWEL OR 17X24 6PK STRL BLUE (TOWEL DISPOSABLE) ×4 IMPLANT
TUBE CONNECTING 12X1/4 (SUCTIONS) ×2 IMPLANT
WATER STERILE IRR 1000ML POUR (IV SOLUTION) IMPLANT

## 2013-09-07 NOTE — Preoperative (Signed)
Beta Blockers   Reason not to administer Beta Blockers:Not Applicable 

## 2013-09-07 NOTE — Anesthesia Postprocedure Evaluation (Signed)
Anesthesia Post Note  Patient: [name]  Procedure(s) Performed: Procedure(s) (LRB): DIRECT LARYNGOSCOPY AND DIRECT LARYNGEAL BIOPSIES (N/A)  Anesthesia type: general  Patient location: PACU  Post pain: Pain level controlled  Post assessment: Patient's Cardiovascular Status Stable  Last Vitals:  Filed Vitals:   09/07/13 1015  BP: 106/61  Pulse: 99  Temp:   Resp: 24    Post vital signs: Reviewed and stable  Level of consciousness: sedated  Complications: No apparent anesthesia complications

## 2013-09-07 NOTE — Transfer of Care (Signed)
Immediate Anesthesia Transfer of Care Note  Patient: [name]  Procedure(s) Performed: Procedure(s): DIRECT LARYNGOSCOPY AND DIRECT LARYNGEAL BIOPSIES (N/A)  Patient Location: PACU  Anesthesia Type:General  Level of Consciousness: awake, alert  and oriented  Airway & Oxygen Therapy: Patient Spontanous Breathing and Patient connected to face mask oxygen  Post-op Assessment: Report given to PACU RN  Post vital signs: Reviewed and stable  Complications: No apparent anesthesia complications

## 2013-09-07 NOTE — Anesthesia Preprocedure Evaluation (Addendum)
Anesthesia Evaluation  Patient identified by MRN, date of birth, ID band Patient awake    Reviewed: Allergy & Precautions, H&P , NPO status , Patient's Chart, lab work & pertinent test results  History of Anesthesia Complications Negative for: history of anesthetic complications  Airway Mallampati: II TM Distance: >3 FB Neck ROM: Full    Dental  (+) Edentulous Upper and Dental Advisory Given   Pulmonary COPD         Cardiovascular hypertension, Pt. on home beta blockers + Peripheral Vascular Disease Rhythm:Regular Rate:Normal     Neuro/Psych    GI/Hepatic negative GI ROS, Neg liver ROS,   Endo/Other  negative endocrine ROS  Renal/GU negative Renal ROS     Musculoskeletal   Abdominal   Peds  Hematology negative hematology ROS (+)   Anesthesia Other Findings   Reproductive/Obstetrics                         Anesthesia Physical Anesthesia Plan  ASA: III  Anesthesia Plan: General   Post-op Pain Management:    Induction: Intravenous  Airway Management Planned: Oral ETT  Additional Equipment:   Intra-op Plan:   Post-operative Plan: Extubation in OR  Informed Consent:   Plan Discussed with: CRNA, Anesthesiologist and Surgeon  Anesthesia Plan Comments:         Anesthesia Quick Evaluation

## 2013-09-07 NOTE — Brief Op Note (Signed)
09/07/2013  9:21 AM  PATIENT:  [name]  79 y.o. female  PRE-OPERATIVE DIAGNOSIS:  NEOPLASM OF UNCERTAIN BEHAVIOR SUPRAGLOTTIS  POST-OPERATIVE DIAGNOSIS:  NEOPLASM OF UNCERTAIN BEHAVIOR SUPRAGLOTTIS  PROCEDURE:  Procedure(s): DIRECT LARYNGOSCOPY AND DIRECT LARYNGEAL BIOPSIES (N/A)  SURGEON:  Surgeon(s) and Role:    * Osborn Coho, MD - Primary  PHYSICIAN ASSISTANT:   ASSISTANTS: none   ANESTHESIA:   general  EBL:   Min  BLOOD ADMINISTERED:none  DRAINS: none   LOCAL MEDICATIONS USED:  NONE  SPECIMEN:  Source of Specimen:  Left laryngeal tumor  DISPOSITION OF SPECIMEN:  PATHOLOGY  COUNTS:  YES  TOURNIQUET:  * No tourniquets in log *  DICTATION: .Other Dictation: Dictation Number [id number]  PLAN OF CARE: Discharge to home after PACU  PATIENT DISPOSITION:  PACU - hemodynamically stable.   Delay start of Pharmacological VTE agent (>24hrs) due to surgical blood loss or risk of bleeding: not applicable

## 2013-09-07 NOTE — Anesthesia Procedure Notes (Signed)
Procedure Name: Intubation Date/Time: 09/07/2013 9:00 AM Performed by: Jefm Miles E Pre-anesthesia Checklist: Patient identified, Emergency Drugs available, Suction available, Patient being monitored and Timeout performed Patient Re-evaluated:Patient Re-evaluated prior to inductionOxygen Delivery Method: Circle system utilized Preoxygenation: Pre-oxygenation with 100% oxygen Ventilation: Mask ventilation without difficulty Laryngoscope Size: Mac and 3 Grade View: Grade I Tube type: Oral Tube size: 6.0 mm Number of attempts: 1 Airway Equipment and Method: Stylet Placement Confirmation: ETT inserted through vocal cords under direct vision,  positive ETCO2 and breath sounds checked- equal and bilateral Secured at: 20 cm Tube secured with: Tape Dental Injury: Teeth and Oropharynx as per pre-operative assessment

## 2013-09-07 NOTE — H&P (Signed)
[name] is an 79 y.o. female.   Chief Complaint: Left laryngeal tumor HPI: Chronic smoker with prg dysphagia  Past Medical History  Diagnosis Date  . Aortic atherosclerosis   . PAD (peripheral artery disease)   . COPD (chronic obstructive pulmonary disease)   . HTN (hypertension)   . Hypercholesteremia   . Glaucoma   . Bronchitis   . Fractured coccyx   . AAA (abdominal aortic aneurysm)   . PAD (peripheral artery disease)   . Renal cell carcinoma 1995    Kidney removal  . Tobacco abuse 10/27/2012    Past Surgical History  Procedure Laterality Date  . Nephrectomy      LEFT/CANCER  . Renal cryoablation      Right with radiation  . Hip fracture surgery  2009  . Tonsillectomy    . Breast surgery      breast implants  . Colonoscopy      Family History  Problem Relation Age of Onset  . Heart attack Father   . Hypertension Father   . Heart disease Father   . Hypertension Mother   . Cancer Mother     MOUTH,BREAST,SPINE  . Diabetes Daughter   . Hypertension Daughter    Social History:  reports that she has been smoking Cigarettes.  She has a 32.5 pack-year smoking history. She has never used smokeless tobacco. She reports that  drinks alcohol. She reports that she does not use illicit drugs.  Allergies:  Allergies  Allergen Reactions  . Tape     Plastic tape tears skin    Medications Prior to Admission  Medication Sig Dispense Refill  . amLODipine (NORVASC) 5 MG tablet Take 1 tablet (5 mg total) by mouth daily.  90 tablet  3  . aspirin 81 MG tablet Take 81 mg by mouth daily.        . beta carotene w/minerals (OCUVITE) tablet Take 1 tablet by mouth daily.        . bimatoprost (LUMIGAN) 0.03 % ophthalmic drops Place 1 drop into both eyes at bedtime.       . brimonidine (ALPHAGAN) 0.15 % ophthalmic solution Place 1 drop into both eyes daily.       . brinzolamide (AZOPT) 1 % ophthalmic suspension Place 1 drop into both eyes daily.       . ergocalciferol (VITAMIN  D2) 50000 UNITS capsule Take 50,000 Units by mouth once a week. Pt expressed no particular day of the week      . Fluticasone-Salmeterol (ADVAIR HFA IN) Inhale into the lungs as needed.        . metoprolol tartrate (LOPRESSOR) 25 MG tablet Take 12.5 mg by mouth 2 (two) times daily.      . rosuvastatin (CRESTOR) 20 MG tablet Take 1 tablet (20 mg total) by mouth daily.  90 tablet  3  . sodium chloride 0.9 % SOLN 1,000 mL with calcium chloride 10 % SOLN 8 g Inject 8 g into the vein as needed. YEARLY (SEPTEMBER)       . Zoledronic Acid (RECLAST IV) Inject into the vein See admin instructions. Once a year.  Had in Sept 2014.        Results for orders placed during the hospital encounter of 09/05/13 (from the past 48 hour(s))  BASIC METABOLIC PANEL     Status: Abnormal   Collection Time    09/05/13  3:45 PM      Result Value Range   Sodium 139  135 -  145 mEq/L   Potassium 4.5  3.5 - 5.1 mEq/L   Chloride 104  96 - 112 mEq/L   CO2 27  19 - 32 mEq/L   Glucose, Bld 90  70 - 99 mg/dL   BUN 11  6 - 23 mg/dL   Creatinine, Ser 1.61  0.50 - 1.10 mg/dL   Calcium 8.7  8.4 - 09.6 mg/dL   GFR calc non Af Amer 80 (*) >90 mL/min   GFR calc Af Amer >90  >90 mL/min   Comment: (NOTE)     The eGFR has been calculated using the CKD EPI equation.     This calculation has not been validated in all clinical situations.     eGFR's persistently <90 mL/min signify possible Chronic Kidney     Disease.  CBC     Status: None   Collection Time    09/05/13  3:45 PM      Result Value Range   WBC 10.5  4.0 - 10.5 K/uL   RBC 4.60  3.87 - 5.11 MIL/uL   Hemoglobin 14.1  12.0 - 15.0 g/dL   HCT 04.5  40.9 - 81.1 %   MCV 92.0  78.0 - 100.0 fL   MCH 30.7  26.0 - 34.0 pg   MCHC 33.3  30.0 - 36.0 g/dL   RDW 91.4  78.2 - 95.6 %   Platelets 362  150 - 400 K/uL   Dg Chest 2 View  09/05/2013   *RADIOLOGY REPORT*  Clinical Data: Preop.  CHEST - 2 VIEW  Comparison: 05/06/2010.  Findings: Trachea is midline.  Heart size stable.   Calcified breast prostheses somewhat obscure the lower hemithoraces.  Lungs are hyperinflated but otherwise grossly clear.  No pleural fluid.  IMPRESSION: COPD without acute finding.   Original Report Authenticated By: Leanna Battles, M.D.    Review of Systems  Constitutional: Negative.   HENT:       Left piriform sinus tumor  Respiratory: Negative.   Cardiovascular: Negative.   Gastrointestinal: Negative.   Neurological: Negative.     Blood pressure 145/80, pulse 99, temperature 97.1 F (36.2 C), temperature source Oral, resp. rate 17, SpO2 97.00%. Physical Exam  Constitutional: She appears well-developed and well-nourished.  HENT:  Left piriform sinus tumor  Neck: Normal range of motion. Neck supple.  Cardiovascular: Normal rate.   Respiratory: Effort normal.  GI: Soft.  Musculoskeletal: Normal range of motion.  Neurological: She is alert.     Assessment/Plan Adm for OP DL and BX  [name] 09/07/2013, 8:48 AM

## 2013-09-08 NOTE — Op Note (Signed)
NAMEREALITY, DEJONGE                ACCOUNT NO.:  [id number]  MEDICAL RECORD NO.:  [id number]  LOCATION:  [facility]                         FACILITY:  [facility]  PHYSICIAN:  Kinnie Scales. Annalee Genta, M.D.DATE OF BIRTH:  1928/11/22  DATE OF PROCEDURE:  09/07/2013 DATE OF DISCHARGE:  09/07/2013                              OPERATIVE REPORT   PREOPERATIVE DIAGNOSES: 1. Left laryngeal tumor. 2. Chronic hoarseness and dysphagia.  POSTOPERATIVE DIAGNOSES: 1. Left laryngeal tumor. 2. Chronic hoarseness and dysphagia.  INDICATIONS FOR SURGERY: 1. Left laryngeal tumor. 2. Chronic hoarseness and dysphagia.  SURGICAL PROCEDURES:  Direct laryngoscopy and biopsy of laryngeal tumor.  ANESTHESIA:  General endotracheal.  SURGEON:  Kinnie Scales. Annalee Genta, MD  COMPLICATIONS:  None.  ESTIMATED BLOOD LOSS:  Minimal.  CONDITION:  The patient was transferred from the operating room to the recovery room in stable condition.  BRIEF HISTORY:  The patient is an 79 year old white female who was referred to our office with a history of chronic hoarseness and dysphagia.  Examination in the office showed an exophytic tumor involving the left aspect of the larynx and pyriform sinus.  She underwent workup including CT scanning and in-office biopsy which was nondiagnostic.  Given her history and findings, I recommended that we undertake direct laryngoscopy with biopsy of the lesion under general anesthesia.  The risks and benefits of the procedure were discussed in detail with the patient and her husband.  They understood and concurred with our plan for surgery which is scheduled as an outpatient under general anesthesia.  DESCRIPTION OF PROCEDURE:  The patient was brought to the operating room and placed in supine position on the operating table.  General endotracheal anesthesia was established without difficulty.  When the patient adequately anesthetized, she was positioned on the operating table and then prepped  and draped in a sterile fashion.  A Dedo laryngoscope was then used to examine the oral cavity, oropharynx, and larynx.  The patient had an exophytic mass involving the laryngeal aspect of the left piriform sinus and extending superiorly to the level of the area epiglottic fold.  Multiple cup forceps biopsies were taken under direct visualization, were sent to Pathology for gross microscopic evaluation.  There was minimal bleeding.  The patient's airway was stable.  Laryngoscope was removed without difficulty.  She was then awakened from anesthetic, extubated, and transferred from the operating room to the recovery room in stable condition.  No complications and blood loss was minimal.          ______________________________ Kinnie Scales. Annalee Genta, M.D.     DLS/MEDQ  D:  16/09/9603  T:  09/08/2013  Job:  [id number]

## 2013-09-10 ENCOUNTER — Ambulatory Visit: Payer: Medicare Other

## 2013-09-10 ENCOUNTER — Ambulatory Visit: Payer: Medicare Other | Admitting: Hematology and Oncology

## 2013-09-11 ENCOUNTER — Encounter (HOSPITAL_COMMUNITY): Payer: Self-pay | Admitting: Otolaryngology

## 2013-09-11 ENCOUNTER — Encounter (HOSPITAL_COMMUNITY)
Admission: RE | Admit: 2013-09-11 | Discharge: 2013-09-11 | Disposition: A | Discharge status: Home / Self Care | Payer: Medicare Other | Source: Ambulatory Visit | Attending: Hematology and Oncology | Admitting: Hematology and Oncology

## 2013-09-11 DIAGNOSIS — C329 Malignant neoplasm of larynx, unspecified: Secondary | ICD-10-CM | POA: Insufficient documentation

## 2013-09-11 DIAGNOSIS — R911 Solitary pulmonary nodule: Secondary | ICD-10-CM | POA: Insufficient documentation

## 2013-09-11 DIAGNOSIS — I714 Abdominal aortic aneurysm, without rupture, unspecified: Secondary | ICD-10-CM | POA: Insufficient documentation

## 2013-09-11 DIAGNOSIS — Z85528 Personal history of other malignant neoplasm of kidney: Secondary | ICD-10-CM | POA: Insufficient documentation

## 2013-09-11 MED ORDER — FLUDEOXYGLUCOSE F - 18 (FDG) INJECTION
13.7000 | Freq: Once | INTRAVENOUS | Status: AC | PRN
  Administered 2013-09-11: 13.7 via INTRAVENOUS

## 2013-09-12 ENCOUNTER — Telehealth: Payer: Self-pay | Admitting: *Deleted

## 2013-09-12 ENCOUNTER — Telehealth: Payer: Self-pay | Admitting: Hematology and Oncology

## 2013-09-12 ENCOUNTER — Other Ambulatory Visit: Payer: Self-pay | Admitting: Hematology and Oncology

## 2013-09-12 ENCOUNTER — Ambulatory Visit (HOSPITAL_BASED_OUTPATIENT_CLINIC_OR_DEPARTMENT_OTHER): Payer: Medicare Other | Admitting: Hematology and Oncology

## 2013-09-12 ENCOUNTER — Encounter: Payer: Self-pay | Admitting: *Deleted

## 2013-09-12 ENCOUNTER — Other Ambulatory Visit: Payer: Self-pay | Admitting: Radiology

## 2013-09-12 ENCOUNTER — Encounter: Payer: Self-pay | Admitting: Hematology and Oncology

## 2013-09-12 ENCOUNTER — Other Ambulatory Visit: Payer: Medicare Other

## 2013-09-12 VITALS — BP 129/82 | HR 87 | Temp 98.1°F | Resp 18 | Ht 62.0 in | Wt 82.3 lb

## 2013-09-12 DIAGNOSIS — C329 Malignant neoplasm of larynx, unspecified: Secondary | ICD-10-CM

## 2013-09-12 DIAGNOSIS — Z85528 Personal history of other malignant neoplasm of kidney: Secondary | ICD-10-CM

## 2013-09-12 DIAGNOSIS — C12 Malignant neoplasm of pyriform sinus: Secondary | ICD-10-CM

## 2013-09-12 DIAGNOSIS — R911 Solitary pulmonary nodule: Secondary | ICD-10-CM

## 2013-09-12 HISTORY — DX: Malignant neoplasm of larynx, unspecified: C32.9

## 2013-09-12 MED ORDER — UNABLE TO FIND: Status: DC

## 2013-09-12 NOTE — Telephone Encounter (Signed)
gv and printed appt sched and avs for OCT...MW add tx.

## 2013-09-12 NOTE — Progress Notes (Signed)
[name]  PERINI,Victoria A, MD  DIAGNOSIS: Squamous cell carcinoma of the larynx, lung mass  SUMMARY OF ONCOLOGIC HISTORY: 1) I reviewed the records extensively and collaborated the history with the patient and her husband. Approximately 20 years ago, she underwent Newman left nephrectomy after presentation with weight loss. She never received adjuvant treatment for this. She had disease recurrence in the contralateral kidney which she has underwent multiple ablation therapy to the right kidney, Newman loss treatment was approximately 4 years ago. She never followup with Newman medical oncologist. 2) she had progressive dysphagia for approximately 3 months. Due to the difficulty swallowing foods, she has lost approximately 10 pounds of weight. The dysphagia is mainly to solids but not to liquids. She also has choking sensation on Newman regular basis. She had mild change in the quality of her voice. She went to see her primary care provider for further evaluation.  3) On 08/22/2013, she underwent CT scan of the neck which show Newman bulky mass in the left puriform sinus with suprapubic extension to the hiatal bone level with inferior extension just above the level. This also Newman suspicious lymph node on the left side. Incidentally, this also Newman suspicious lesion on the right upper lobe. She was referred to Dr. Annalee Genta for further evaluation. 4) On 08/27/2013, she had laryngoscope be in biopsy performed. According to the operative report, there is erosive, bulky in, mucosal lesion involving the epiglottic fold and piriform sinus worrisome for cancer. Unfortunately biopsy is inconclusive for squamous cell carcinoma but the show evidence of dysplasia.  INTERVAL HISTORY: [name] 79 y.o. female returns for followup related to her history of cancer. She underwent direct laryngoscopy and biopsy last week. On 09/07/2013, direct laryngoscopy revealed an exophytic mass involving the laryngeal  aspect of the left piriform sinus and extending superiorly to the level of the area epiglottic fold. Multiple cup forceps biopsies were taken  under direct visualization, were sent to Pathology. Biopsy came back positive for squamous cell carcinoma. On 09/11/2013, she had Newman PET CT scan which show hypermetabolic mass in the larynx, possible regional lymph node involvement, as well as Newman mass in the right lung suspicious for primary lung cancer. Over the weekend, she complained of severe ear pain and went to urgent care for evaluation. She was prescribed topical ear drop and Augmentin. Since then, she developed diarrhea. She has lost some weight because of the diarrhea.  I have reviewed the past medical history, past surgical history, social history and family history with the patient and they are unchanged from previous Newman.  ALLERGIES:  is allergic to tape.  MEDICATIONS: Current outpatient prescriptions:amLODipine (NORVASC) 5 MG tablet, Take 1 tablet (5 mg total) by mouth daily., Disp: 90 tablet, Rfl: 3;  amoxicillin (AMOXIL) 875 MG tablet, Take 875 mg by mouth daily., Disp: , Rfl: ;  aspirin 81 MG tablet, Take 81 mg by mouth daily.  , Disp: , Rfl: ;  beta carotene w/minerals (OCUVITE) tablet, Take 1 tablet by mouth daily.  , Disp: , Rfl:  bimatoprost (LUMIGAN) 0.03 % ophthalmic drops, Place 1 drop into both eyes at bedtime. , Disp: , Rfl: ;  brimonidine (ALPHAGAN) 0.15 % ophthalmic solution, Place 1 drop into both eyes daily. , Disp: , Rfl: ;  brinzolamide (AZOPT) 1 % ophthalmic suspension, Place 1 drop into both eyes daily. , Disp: , Rfl: ;  ciprofloxacin-dexamethasone (CIPRODEX) otic suspension, 4 drops 2 (two) times daily., Disp: , Rfl:  ergocalciferol (VITAMIN D2) 50000 UNITS capsule, Take 50,000 Units by mouth once Newman week. Pt expressed no particular day of the week, Disp: , Rfl: ;  Fluticasone-Salmeterol (ADVAIR HFA IN), Inhale into the lungs as needed.  , Disp: , Rfl: ;  Hydrocodone-Acetaminophen  (LORTAB) 10-300 MG/15ML SOLN, Take 5-10 mLs by mouth every 4 (four) hours as needed (Pain)., Disp: 120 mL, Rfl: 0 metoprolol tartrate (LOPRESSOR) 25 MG tablet, Take 12.5 mg by mouth 2 (two) times daily., Disp: , Rfl: ;  rosuvastatin (CRESTOR) 20 MG tablet, Take 1 tablet (20 mg total) by mouth daily., Disp: 90 tablet, Rfl: 3;  sodium chloride 0.9 % SOLN 1,000 mL with calcium chloride 10 % SOLN 8 g, Inject 8 g into the vein as needed. YEARLY (SEPTEMBER) , Disp: , Rfl:  Zoledronic Acid (RECLAST IV), Inject into the vein See admin instructions. Once Newman year.  Had in Sept 2014., Disp: , Rfl: ;  UNABLE TO FIND, Med Name: Cranial Prothesis, Disp: 1 each, Rfl: 0  REVIEW OF SYSTEMS:   Constitutional: Denies fevers, chills  Eyes: Denies blurriness of vision Ears, nose, mouth, throat, and face: Denies mucositis or sore throat Respiratory: Denies cough, dyspnea or wheezes Cardiovascular: Denies palpitation, chest discomfort or lower extremity swelling Skin: Denies abnormal skin rashes Lymphatics: Denies new lymphadenopathy or easy bruising Neurological:Denies numbness, tingling or new weaknesses Behavioral/Psych: Mood is stable, no new changes  All other systems were reviewed with the patient and are negative.  PHYSICAL EXAMINATION: ECOG PERFORMANCE STATUS: 1 - Symptomatic but completely ambulatory  Filed Vitals:   09/12/13 1245  BP: 129/82  Pulse: 87  Temp: 98.1 F (36.7 C)  Resp: 18   Filed Weights   09/12/13 1245  Weight: 82 lb 4.8 oz (37.331 kg)    GENERAL:alert, no distress and comfortable she looks thin and cachectic SKIN: skin color, texture, turgor are normal, no rashes or significant lesions EYES: normal, Conjunctiva are pink and non-injected, sclera clear OROPHARYNX:no exudate, no erythema and lips, buccal mucosa, and tongue normal  NECK: supple, thyroid normal size, non-tender, without nodularity LYMPH:  Small palpable lymphadenopathy in the cervical, but none in the axillary or  inguinal regions LUNGS: clear to auscultation and percussion with normal breathing effort HEART: regular rate & rhythm and no murmurs and no lower extremity edema ABDOMEN:abdomen soft, non-tender and normal bowel sounds Musculoskeletal:no cyanosis of digits and no clubbing  NEURO: alert & oriented x 3 with fluent speech, no focal motor/sensory deficits she has very poor hearing  LABORATORY DATA:  I have reviewed the data as listed    Component Value Date/Time   NA 139 09/05/2013 1545   K 4.5 09/05/2013 1545   CL 104 09/05/2013 1545   CO2 27 09/05/2013 1545   GLUCOSE 90 09/05/2013 1545   BUN 11 09/05/2013 1545   CREATININE 0.67 09/05/2013 1545   CREATININE 0.64 06/27/2013 1138   CALCIUM 8.7 09/05/2013 1545   PROT 6.0 01/12/2011 0431   ALBUMIN 3.0* 01/12/2011 0431   AST 15 01/12/2011 0431   ALT 11 01/12/2011 0431   ALKPHOS 56 01/12/2011 0431   BILITOT 0.7 01/12/2011 0431   GFRNONAA 80* 09/05/2013 1545   GFRAA >90 09/05/2013 1545    I No results found for this basename: SPEP, UPEP,  kappa and lambda light chains    Lab Results  Component Value Date   WBC 10.5 09/05/2013   NEUTROABS 6.5 01/12/2011   HGB 14.1 09/05/2013   HCT 42.3 09/05/2013   MCV 92.0 09/05/2013  PLT 362 09/05/2013      Chemistry      Component Value Date/Time   NA 139 09/05/2013 1545   K 4.5 09/05/2013 1545   CL 104 09/05/2013 1545   CO2 27 09/05/2013 1545   BUN 11 09/05/2013 1545   CREATININE 0.67 09/05/2013 1545   CREATININE 0.64 06/27/2013 1138      Component Value Date/Time   CALCIUM 8.7 09/05/2013 1545   ALKPHOS 56 01/12/2011 0431   AST 15 01/12/2011 0431   ALT 11 01/12/2011 0431   BILITOT 0.7 01/12/2011 0431      RADIOGRAPHIC STUDIES: The imaging studies were reviewed with her and her husband I have personally reviewed the radiological images as listed and agreed with the findings in the report. Nm Pet Image Initial (pi) Skull Base To Thigh  09/11/2013   CLINICAL DATA:  Initial treatment strategy for laryngeal  carcinoma. Previous treatment for renal cell carcinoma.  EXAM: NUCLEAR MEDICINE PET SKULL BASE TO THIGH  FASTING BLOOD GLUCOSE:  Value: 124 mg/dl  TECHNIQUE: 16.1 mCi W-96 FDG was injected intravenously. CT data was obtained and used for attenuation correction and anatomic localization only. (This was not acquired as Newman diagnostic CT examination.) Additional exam technical data entered on technologist worksheet.  COMPARISON:  None.  FINDINGS: NECK  Newman left supraglottic mass is seen with diffuse hypermetabolic activity gallbladder and maximum SUV measuring 17.1.  No left-sided hypermetabolic lymph nodes are seen. Newman small less than 1 cm hypermetabolic lymph node is seen on the right, in the high upper internal jugular chain (level IIa) on image 27-28, which has Newman maximum SUV of 4.6.  CHEST  No hypermetabolic mediastinal or hilar nodes. Newman 1.5 cm spiculated nodule is seen in the posterior right upper lobe which has hypermetabolic activity. The maximum SUV measures 4.5, and in the spiculated morphology by CT suggests Newman primary bronchogenic carcinoma. Smaller spiculated nodule measuring approximately 8 mm in the right lung apex shows no hypermetabolic activity, but is below size threshold for reliable detection of malignancy.  ABDOMEN/PELVIS  No abnormal hypermetabolic activity within the liver, pancreas, adrenal glands, or spleen. No hypermetabolic lymph nodes in the abdomen or pelvis. 4 cm abdominal aortic aneurysm again noted, without significant change compared to prior CT on 07/18/2013.  SKELETON  No focal hypermetabolic activity to suggest skeletal metastasis.  IMPRESSION: Hypermetabolic left supraglottic laryngeal mass, consistent with laryngeal carcinoma.  Tiny less than 1 cm hypermetabolic lymph node in the right neck high upper internal jugular chain ; metastatic disease cannot be excluded.  1.5 cm spiculated hypermetabolic nodule in the posterior right upper lobe, with more morphology favoring Newman primary  bronchogenic carcinoma over pulmonary metastasis. Newman 2nd 8 mm spiculated nodule in the right lung apex shows absence of hypermetabolic activity, but is below PET size threshold for reliable detection of malignancy.  No evidence of malignancy within the abdomen or pelvis.   Electronically Signed   By: Myles Rosenthal   On: 09/11/2013 12:00    ASSESSMENT: supraglottic squamous cell carcinoma T2, N1, Mx laryngeal cancer and indetermine lung mass suspicious for primary lung cancer with possible additional nodule in the right apex of the lung  PLAN:  #1 laryngeal cancer We discussed about her case at the tumor board conference today. In the presence of possible metastatic disease I recommend systemic palliative chemotherapy only. I recommend consideration for palliative radiation therapy but the patient ultimately decide on systemic chemotherapy only. I will go ahead and place an order for  Infuse-Newman-Port. We'll provide chemotherapy education. Given her age and comorbidities, I recommend weekly carboplatin and Taxol 3 weeks on, 1 week off which I felt might be easier for her to tolerate. Some of the side effects that we discussed, including, but not limited to, risk of pancytopenia, infection, peripheral neuropathy, nausea vomiting, and alopecia. She is in agreement to proceed. I would try to get her an appointment to start chemotherapy next week. #2 lung mass This could be primary lung cancer given her significant smoking history. Hopefully systemic treatment as outlined above might treat the lung cancer as well I would not recommend biopsy at this point. #3 history of kidney cancer The lung mass could be Newman focus of metastatic kidney cancer. At this point in time, we will observe and it is not life threatening #4 malnutrition and cachexia She has nutritional supplements at home. I recommend 3 nutritional supplements Newman day on top of her usual oral intake #5 smoking The patient is not interested to quit.  Counseling was provided #6 diarrhea This could be due to recent antibiotic therapy. I recommended she stop her oral antibiotic but can continue on the topical eardrops All questions were answered. The patient knows to call the clinic with any problems, questions or concerns. We can certainly see the patient much sooner if necessary. No barriers to learning was detected. Christiann Hagerty, MD 09/12/2013 1:37 PM

## 2013-09-12 NOTE — Telephone Encounter (Signed)
Per staff message and POF I have scheduled appts.  JMW  

## 2013-09-12 NOTE — Telephone Encounter (Signed)
gv andprinted appt sched and avs for pt for OCT emailed MW to add tx...  °

## 2013-09-12 NOTE — Progress Notes (Signed)
Pt advised she weighted 88 lbs last week and today she was 82lbs. Pt recently started drinking ensure and has been having "quite a bit" of diarrhea.

## 2013-09-13 ENCOUNTER — Other Ambulatory Visit: Payer: Self-pay | Admitting: Hematology and Oncology

## 2013-09-13 ENCOUNTER — Ambulatory Visit (HOSPITAL_COMMUNITY)
Admission: RE | Admit: 2013-09-13 | Discharge: 2013-09-13 | Disposition: A | Discharge status: Home / Self Care | Payer: Medicare Other | Source: Ambulatory Visit | Attending: Hematology and Oncology | Admitting: Hematology and Oncology

## 2013-09-13 ENCOUNTER — Encounter (HOSPITAL_COMMUNITY): Payer: Self-pay

## 2013-09-13 DIAGNOSIS — F17200 Nicotine dependence, unspecified, uncomplicated: Secondary | ICD-10-CM | POA: Insufficient documentation

## 2013-09-13 DIAGNOSIS — Z85528 Personal history of other malignant neoplasm of kidney: Secondary | ICD-10-CM | POA: Insufficient documentation

## 2013-09-13 DIAGNOSIS — R222 Localized swelling, mass and lump, trunk: Secondary | ICD-10-CM | POA: Insufficient documentation

## 2013-09-13 DIAGNOSIS — Z905 Acquired absence of kidney: Secondary | ICD-10-CM | POA: Insufficient documentation

## 2013-09-13 DIAGNOSIS — I10 Essential (primary) hypertension: Secondary | ICD-10-CM | POA: Insufficient documentation

## 2013-09-13 DIAGNOSIS — I739 Peripheral vascular disease, unspecified: Secondary | ICD-10-CM | POA: Insufficient documentation

## 2013-09-13 DIAGNOSIS — C329 Malignant neoplasm of larynx, unspecified: Secondary | ICD-10-CM | POA: Insufficient documentation

## 2013-09-13 DIAGNOSIS — J4489 Other specified chronic obstructive pulmonary disease: Secondary | ICD-10-CM | POA: Insufficient documentation

## 2013-09-13 DIAGNOSIS — J449 Chronic obstructive pulmonary disease, unspecified: Secondary | ICD-10-CM | POA: Insufficient documentation

## 2013-09-13 LAB — CBC WITH DIFFERENTIAL/PLATELET
Basophils Absolute: 0 10*3/uL (ref 0.0–0.1)
Basophils Relative: 0 % (ref 0–1)
Eosinophils Absolute: 0 10*3/uL (ref 0.0–0.7)
Eosinophils Relative: 0 % (ref 0–5)
HCT: 39.3 % (ref 36.0–46.0)
Hemoglobin: 13.3 g/dL (ref 12.0–15.0)
Lymphocytes Relative: 11 % — ABNORMAL LOW (ref 12–46)
MCH: 30.6 pg (ref 26.0–34.0)
MCHC: 33.8 g/dL (ref 30.0–36.0)
MCV: 90.3 fL (ref 78.0–100.0)
Monocytes Absolute: 1.3 10*3/uL — ABNORMAL HIGH (ref 0.1–1.0)
Monocytes Relative: 9 % (ref 3–12)
Neutro Abs: 11 10*3/uL — ABNORMAL HIGH (ref 1.7–7.7)
Platelets: 372 10*3/uL (ref 150–400)
RDW: 13.4 % (ref 11.5–15.5)

## 2013-09-13 MED ORDER — HEPARIN SOD (PORK) LOCK FLUSH 100 UNIT/ML IV SOLN
500.0000 [IU] | Freq: Once | INTRAVENOUS | Status: AC
  Administered 2013-09-13: 500 [IU] via INTRAVENOUS

## 2013-09-13 MED ORDER — LIDOCAINE-EPINEPHRINE (PF) 2 %-1:200000 IJ SOLN
INTRAMUSCULAR | Status: AC
  Filled 2013-09-13: qty 20

## 2013-09-13 MED ORDER — SODIUM CHLORIDE 0.9 % IV SOLN
INTRAVENOUS | Status: DC
  Administered 2013-09-13: 12:00:00 via INTRAVENOUS

## 2013-09-13 MED ORDER — FENTANYL CITRATE 0.05 MG/ML IJ SOLN
INTRAMUSCULAR | Status: AC
  Filled 2013-09-13: qty 4

## 2013-09-13 MED ORDER — FENTANYL CITRATE 0.05 MG/ML IJ SOLN
INTRAMUSCULAR | Status: AC | PRN
  Administered 2013-09-13 (×2): 50 ug via INTRAVENOUS

## 2013-09-13 MED ORDER — CEFAZOLIN SODIUM-DEXTROSE 2-3 GM-% IV SOLR
INTRAVENOUS | Status: AC
  Filled 2013-09-13: qty 50

## 2013-09-13 MED ORDER — MIDAZOLAM HCL 2 MG/2ML IJ SOLN
INTRAMUSCULAR | Status: AC
  Filled 2013-09-13: qty 4

## 2013-09-13 MED ORDER — MIDAZOLAM HCL 2 MG/2ML IJ SOLN
INTRAMUSCULAR | Status: AC | PRN
  Administered 2013-09-13 (×2): 1 mg via INTRAVENOUS

## 2013-09-13 MED ORDER — CEFAZOLIN SODIUM-DEXTROSE 2-3 GM-% IV SOLR
2.0000 g | INTRAVENOUS | Status: AC
  Administered 2013-09-13: 2 g via INTRAVENOUS

## 2013-09-13 NOTE — Procedures (Signed)
Placement of right jugular portacath.  Tip in lower SVC and ready to use.  No immediate complication.   

## 2013-09-13 NOTE — H&P (Signed)
Chief Complaint: "I am here for a port placement." Referring Physician: Dr. Bertis Ruddy HPI: [name] is an 79 y.o. female with PMHx of renal carcinoma s/p left nephrectomy and right renal cryoablation. Patient now with laryngeal cancer and lung mass suspicious for cancer. She is here today for a port-a-catheter placement. She admits to weight loss. She denies any chest pain or shortness of breath. She denies any fever or chills. She denies any urinary symptoms. She denies any diarrhea. She states this last Sunday her right ear tympanic membrane ruptured and she is on antibiotic/steroid drops for this. She denies any blood in her stool or urine. Patient is a current everyday tobacco user.   Past Medical History:  Past Medical History  Diagnosis Date  . Aortic atherosclerosis   . PAD (peripheral artery disease)   . COPD (chronic obstructive pulmonary disease)   . HTN (hypertension)   . Hypercholesteremia   . Glaucoma   . Bronchitis   . Fractured coccyx   . AAA (abdominal aortic aneurysm)   . PAD (peripheral artery disease)   . Renal cell carcinoma 1995    Kidney removal  . Tobacco abuse 10/27/2012  . Laryngeal cancer 09/12/2013    Past Surgical History:  Past Surgical History  Procedure Laterality Date  . Nephrectomy      LEFT/CANCER  . Renal cryoablation      Right with radiation  . Hip fracture surgery  2009  . Tonsillectomy    . Breast surgery      breast implants  . Colonoscopy    . Direct laryngoscopy N/A 09/07/2013    Procedure: DIRECT LARYNGOSCOPY AND DIRECT LARYNGEAL BIOPSIES;  Surgeon: Osborn Coho, MD;  Location: [facility] OR;  Service: ENT;  Laterality: N/A;    Family History:  Family History  Problem Relation Age of Onset  . Heart attack Father   . Hypertension Father   . Heart disease Father   . Hypertension Mother   . Cancer Mother     MOUTH,BREAST,SPINE  . Diabetes Daughter   . Hypertension Daughter     Social History:  reports that she has been smoking  Cigarettes.  She has a 32.5 pack-year smoking history. She has never used smokeless tobacco. She reports that  drinks alcohol. She reports that she does not use illicit drugs.  Allergies:  Allergies  Allergen Reactions  . Tape     Plastic tape tears skin      Medication List    ASK your doctor about these medications       ADVAIR HFA IN  Inhale into the lungs as needed.     amLODipine 5 MG tablet  Commonly known as:  NORVASC  Take 1 tablet (5 mg total) by mouth daily.     amoxicillin 875 MG tablet  Commonly known as:  AMOXIL  Take 875 mg by mouth daily.     aspirin 81 MG tablet  Take 81 mg by mouth daily.     beta carotene w/minerals tablet  Take 1 tablet by mouth daily.     brimonidine 0.15 % ophthalmic solution  Commonly known as:  ALPHAGAN  Place 1 drop into both eyes daily.     brinzolamide 1 % ophthalmic suspension  Commonly known as:  AZOPT  Place 1 drop into both eyes daily.     ciprofloxacin-dexamethasone otic suspension  Commonly known as:  CIPRODEX  4 drops 2 (two) times daily.     ergocalciferol 50000 UNITS capsule  Commonly known  as:  VITAMIN D2  Take 50,000 Units by mouth once a week. Pt expressed no particular day of the week     Hydrocodone-Acetaminophen 10-300 MG/15ML Soln  Commonly known as:  LORTAB  Take 5-10 mLs by mouth every 4 (four) hours as needed (Pain).     LUMIGAN 0.03 % ophthalmic solution  Generic drug:  bimatoprost  Place 1 drop into both eyes at bedtime.     metoprolol tartrate 25 MG tablet  Commonly known as:  LOPRESSOR  Take 12.5 mg by mouth 2 (two) times daily.     RECLAST IV  Inject into the vein See admin instructions. Once a year.  Had in Sept 2014.     rosuvastatin 20 MG tablet  Commonly known as:  CRESTOR  Take 1 tablet (20 mg total) by mouth daily.     sodium chloride 0.9 % SOLN 1,000 mL with calcium chloride 10 % SOLN 8 g  Inject 8 g into the vein as needed. YEARLY (SEPTEMBER)     UNABLE TO FIND  Med Name:  Cranial Prothesis       Please HPI for pertinent positives, otherwise complete 10 system ROS negative.  Physical Exam: BP 93/52  Pulse 91  Temp(Src) 97.5 F (36.4 C) (Oral)  Resp 20  Ht 5\' 2"  (1.575 m)  Wt 82 lb (37.195 kg)  BMI 14.99 kg/m2  SpO2 100% Body mass index is 14.99 kg/(m^2).   General Appearance:  Alert, cooperative, no distress, thin and cachectic  Head:  Normocephalic, without obvious abnormality, atraumatic  ENT: Unremarkable  Neck: Supple, symmetrical, trachea midline  Lungs:   Diminished breath sounds bilaterally, no w/r/r, respirations unlabored without use of accessory muscles.  Chest Wall:  No tenderness or deformity  Heart:  Regular rate and rhythm, S1, S2 normal, no murmur, rub or gallop.  Abdomen:   Soft, non-tender, non distended.  Extremities: Extremities with chronic venous changes, atraumatic  Pulses: 1+ and symmetric  Neurologic: Normal affect, no gross deficits.   Results for orders placed during the hospital encounter of 09/13/13 (from the past 48 hour(s))  APTT     Status: None   Collection Time    09/13/13 12:15 PM      Result Value Range   aPTT 27  24 - 37 seconds  CBC WITH DIFFERENTIAL     Status: Abnormal   Collection Time    09/13/13 12:15 PM      Result Value Range   WBC 13.9 (*) 4.0 - 10.5 K/uL   RBC 4.35  3.87 - 5.11 MIL/uL   Hemoglobin 13.3  12.0 - 15.0 g/dL   HCT 27.2  53.6 - 64.4 %   MCV 90.3  78.0 - 100.0 fL   MCH 30.6  26.0 - 34.0 pg   MCHC 33.8  30.0 - 36.0 g/dL   RDW 03.4  74.2 - 59.5 %   Platelets 372  150 - 400 K/uL   Neutrophils Relative % 80 (*) 43 - 77 %   Neutro Abs 11.0 (*) 1.7 - 7.7 K/uL   Lymphocytes Relative 11 (*) 12 - 46 %   Lymphs Abs 1.5  0.7 - 4.0 K/uL   Monocytes Relative 9  3 - 12 %   Monocytes Absolute 1.3 (*) 0.1 - 1.0 K/uL   Eosinophils Relative 0  0 - 5 %   Eosinophils Absolute 0.0  0.0 - 0.7 K/uL   Basophils Relative 0  0 - 1 %   Basophils Absolute 0.0  0.0 - 0.1 K/uL  PROTIME-INR     Status:  None   Collection Time    09/13/13 12:15 PM      Result Value Range   Prothrombin Time 14.5  11.6 - 15.2 seconds   INR 1.15  0.00 - 1.49   No results found.  Assessment/Plan Laryngeal cancer Lung mass suspicious for cancer History of renal cancer Ongoing tobacco abuse. Request for port-a-catheter placement Labs reviewed, patient has been NPO Risks and Benefits discussed with the patient. All of the patient's questions were answered, patient is agreeable to proceed. Consent signed and in chart.   Pattricia Boss D PA-C 09/13/2013, 12:58 PM

## 2013-09-14 ENCOUNTER — Other Ambulatory Visit: Payer: Self-pay | Admitting: *Deleted

## 2013-09-14 ENCOUNTER — Telehealth: Payer: Self-pay | Admitting: *Deleted

## 2013-09-14 MED ORDER — LIDOCAINE-PRILOCAINE 2.5-2.5 % EX CREA: TOPICAL_CREAM | CUTANEOUS | Status: AC | PRN

## 2013-09-14 NOTE — Telephone Encounter (Signed)
Pt's husband called to indicate that his wife had her PAC placed yesterday and will need an Rx for Emla cream.  I relayed message to Dr. Bertis Ruddy via In Basket and called pt back to let them know fulfillment is pending.  Young Berry, RN, BSN, [facility] Navigator [phone]

## 2013-09-20 ENCOUNTER — Ambulatory Visit (HOSPITAL_BASED_OUTPATIENT_CLINIC_OR_DEPARTMENT_OTHER): Payer: Medicare Other

## 2013-09-20 ENCOUNTER — Encounter (INDEPENDENT_AMBULATORY_CARE_PROVIDER_SITE_OTHER): Payer: Self-pay

## 2013-09-20 ENCOUNTER — Other Ambulatory Visit: Payer: Self-pay | Admitting: Hematology and Oncology

## 2013-09-20 ENCOUNTER — Encounter: Payer: Self-pay | Admitting: *Deleted

## 2013-09-20 VITALS — BP 150/90 | HR 86 | Temp 97.4°F | Resp 18

## 2013-09-20 DIAGNOSIS — Z5111 Encounter for antineoplastic chemotherapy: Secondary | ICD-10-CM

## 2013-09-20 DIAGNOSIS — C329 Malignant neoplasm of larynx, unspecified: Secondary | ICD-10-CM

## 2013-09-20 MED ORDER — DIPHENHYDRAMINE HCL 50 MG/ML IJ SOLN
INTRAMUSCULAR | Status: AC
  Filled 2013-09-20: qty 1

## 2013-09-20 MED ORDER — HEPARIN SOD (PORK) LOCK FLUSH 100 UNIT/ML IV SOLN
500.0000 [IU] | Freq: Once | INTRAVENOUS | Status: AC | PRN
  Administered 2013-09-20: 500 [IU]
  Filled 2013-09-20: qty 5

## 2013-09-20 MED ORDER — PACLITAXEL CHEMO INJECTION 300 MG/50ML
80.0000 mg/m2 | Freq: Once | INTRAVENOUS | Status: AC
  Administered 2013-09-20: 102 mg via INTRAVENOUS
  Filled 2013-09-20: qty 17

## 2013-09-20 MED ORDER — DEXAMETHASONE SODIUM PHOSPHATE 20 MG/5ML IJ SOLN
20.0000 mg | Freq: Once | INTRAMUSCULAR | Status: AC
  Administered 2013-09-20: 20 mg via INTRAVENOUS

## 2013-09-20 MED ORDER — DEXAMETHASONE SODIUM PHOSPHATE 20 MG/5ML IJ SOLN
INTRAMUSCULAR | Status: AC
  Filled 2013-09-20: qty 5

## 2013-09-20 MED ORDER — DIPHENHYDRAMINE HCL 50 MG/ML IJ SOLN
25.0000 mg | Freq: Once | INTRAMUSCULAR | Status: AC
  Administered 2013-09-20: 25 mg via INTRAVENOUS

## 2013-09-20 MED ORDER — PROCHLORPERAZINE MALEATE 10 MG PO TABS: 10.0000 mg | ORAL_TABLET | Freq: Four times a day (QID) | ORAL | Status: AC | PRN

## 2013-09-20 MED ORDER — FAMOTIDINE IN NACL 20-0.9 MG/50ML-% IV SOLN
20.0000 mg | Freq: Once | INTRAVENOUS | Status: AC
  Administered 2013-09-20: 20 mg via INTRAVENOUS

## 2013-09-20 MED ORDER — SODIUM CHLORIDE 0.9 % IV SOLN
Freq: Once | INTRAVENOUS | Status: AC
  Administered 2013-09-20: 11:00:00 via INTRAVENOUS

## 2013-09-20 MED ORDER — ONDANSETRON 16 MG/50ML IVPB (CHCC)
INTRAVENOUS | Status: AC
  Filled 2013-09-20: qty 16

## 2013-09-20 MED ORDER — FAMOTIDINE IN NACL 20-0.9 MG/50ML-% IV SOLN
INTRAVENOUS | Status: AC
  Filled 2013-09-20: qty 50

## 2013-09-20 MED ORDER — SODIUM CHLORIDE 0.9 % IJ SOLN
10.0000 mL | INTRAMUSCULAR | Status: DC | PRN
  Administered 2013-09-20: 10 mL
  Filled 2013-09-20: qty 10

## 2013-09-20 MED ORDER — SODIUM CHLORIDE 0.9 % IV SOLN
101.0000 mg | Freq: Once | INTRAVENOUS | Status: AC
  Administered 2013-09-20: 100 mg via INTRAVENOUS
  Filled 2013-09-20: qty 10

## 2013-09-20 MED ORDER — ONDANSETRON HCL 8 MG PO TABS: 8.0000 mg | ORAL_TABLET | Freq: Three times a day (TID) | ORAL | Status: AC | PRN

## 2013-09-20 MED ORDER — DEXAMETHASONE 4 MG PO TABS: 8.0000 mg | ORAL_TABLET | Freq: Two times a day (BID) | ORAL | Status: DC

## 2013-09-20 MED ORDER — ONDANSETRON 16 MG/50ML IVPB (CHCC)
16.0000 mg | Freq: Once | INTRAVENOUS | Status: AC
  Administered 2013-09-20: 16 mg via INTRAVENOUS

## 2013-09-20 NOTE — Progress Notes (Signed)
Met with pt and her spouse while she was receiving her first infusion to provide support and encouragement.  Pt indicated she was "doing well"; expressed appreciation for my getting the Emla cream order processed.  Will continue to navigate as L1 (new) patient.  Young Berry, RN, BSN, [facility] Navigator [phone] .

## 2013-09-20 NOTE — Patient Instructions (Signed)
[facility] Discharge Instructions for Patients Receiving Chemotherapy  Today you received the following chemotherapy agents Taxol/Carboplatin To help prevent nausea and vomiting after your treatment, we encourage you to take your nausea medication as prescribed.  If you develop nausea and vomiting that is not controlled by your nausea medication, call the clinic.   BELOW ARE SYMPTOMS THAT SHOULD BE REPORTED IMMEDIATELY:  *FEVER GREATER THAN 100.5 F  *CHILLS WITH OR WITHOUT FEVER  NAUSEA AND VOMITING THAT IS NOT CONTROLLED WITH YOUR NAUSEA MEDICATION  *UNUSUAL SHORTNESS OF BREATH  *UNUSUAL BRUISING OR BLEEDING  TENDERNESS IN MOUTH AND THROAT WITH OR WITHOUT PRESENCE OF ULCERS  *URINARY PROBLEMS  *BOWEL PROBLEMS  UNUSUAL RASH Items with * indicate a potential emergency and should be followed up as soon as possible.  Feel free to call the clinic you have any questions or concerns. The clinic phone number is [phone].     Paclitaxel injection (Taxol) What is this medicine? PACLITAXEL (PAK li TAX el) is a chemotherapy drug. It targets fast dividing cells, like cancer cells, and causes these cells to die. This medicine is used to treat ovarian cancer, breast cancer, and other cancers. This medicine may be used for other purposes; ask your health care provider or pharmacist if you have questions. What should I tell my health care provider before I take this medicine? They need to know if you have any of these conditions: -blood disorders -irregular heartbeat -infection (especially a virus infection such as chickenpox, cold sores, or herpes) -liver disease -previous or ongoing radiation therapy -an unusual or allergic reaction to paclitaxel, alcohol, polyoxyethylated castor oil, other chemotherapy agents, other medicines, foods, dyes, or preservatives -pregnant or trying to get pregnant -breast-feeding How should I use this medicine? This drug is given as  an infusion into a vein. It is administered in a hospital or clinic by a specially trained health care professional. Talk to your pediatrician regarding the use of this medicine in children. Special care may be needed. Overdosage: If you think you have taken too much of this medicine contact a poison control center or emergency room at once. NOTE: This medicine is only for you. Do not share this medicine with others. What if I miss a dose? It is important not to miss your dose. Call your doctor or health care professional if you are unable to keep an appointment. What may interact with this medicine? Do not take this medicine with any of the following medications: -disulfiram -metronidazole This medicine may also interact with the following medications: -cyclosporine -dexamethasone -diazepam -ketoconazole -medicines to increase blood counts like filgrastim, pegfilgrastim, sargramostim -other chemotherapy drugs like cisplatin, doxorubicin, epirubicin, etoposide, teniposide, vincristine -quinidine -testosterone -vaccines -verapamil Talk to your doctor or health care professional before taking any of these medicines: -acetaminophen -aspirin -ibuprofen -ketoprofen -naproxen This list may not describe all possible interactions. Give your health care provider a list of all the medicines, herbs, non-prescription drugs, or dietary supplements you use. Also tell them if you smoke, drink alcohol, or use illegal drugs. Some items may interact with your medicine. What should I watch for while using this medicine? Your condition will be monitored carefully while you are receiving this medicine. You will need important blood work done while you are taking this medicine. This drug may make you feel generally unwell. This is not uncommon, as chemotherapy can affect healthy cells as well as cancer cells. Report any side effects. Continue your course of treatment even though you   feel ill unless your  doctor tells you to stop. In some cases, you may be given additional medicines to help with side effects. Follow all directions for their use. Call your doctor or health care professional for advice if you get a fever, chills or sore throat, or other symptoms of a cold or flu. Do not treat yourself. This drug decreases your body's ability to fight infections. Try to avoid being around people who are sick. This medicine may increase your risk to bruise or bleed. Call your doctor or health care professional if you notice any unusual bleeding. Be careful brushing and flossing your teeth or using a toothpick because you may get an infection or bleed more easily. If you have any dental work done, tell your dentist you are receiving this medicine. Avoid taking products that contain aspirin, acetaminophen, ibuprofen, naproxen, or ketoprofen unless instructed by your doctor. These medicines may hide a fever. Do not become pregnant while taking this medicine. Women should inform their doctor if they wish to become pregnant or think they might be pregnant. There is a potential for serious side effects to an unborn child. Talk to your health care professional or pharmacist for more information. Do not breast-feed an infant while taking this medicine. Men are advised not to father a child while receiving this medicine. What side effects may I notice from receiving this medicine? Side effects that you should report to your doctor or health care professional as soon as possible: -allergic reactions like skin rash, itching or hives, swelling of the face, lips, or tongue -low blood counts - This drug may decrease the number of white blood cells, red blood cells and platelets. You may be at increased risk for infections and bleeding. -signs of infection - fever or chills, cough, sore throat, pain or difficulty passing urine -signs of decreased platelets or bleeding - bruising, pinpoint red spots on the skin, black,  tarry stools, nosebleeds -signs of decreased red blood cells - unusually weak or tired, fainting spells, lightheadedness -breathing problems -chest pain -high or low blood pressure -mouth sores -nausea and vomiting -pain, swelling, redness or irritation at the injection site -pain, tingling, numbness in the hands or feet -slow or irregular heartbeat -swelling of the ankle, feet, hands Side effects that usually do not require medical attention (report to your doctor or health care professional if they continue or are bothersome): -bone pain -complete hair loss including hair on your head, underarms, pubic hair, eyebrows, and eyelashes -changes in the color of fingernails -diarrhea -loosening of the fingernails -loss of appetite -muscle or joint pain -red flush to skin -sweating This list may not describe all possible side effects. Call your doctor for medical advice about side effects. You may report side effects to FDA at 1-800-FDA-1088. Where should I keep my medicine? This drug is given in a hospital or clinic and will not be stored at home. NOTE: This sheet is a summary. It may not cover all possible information. If you have questions about this medicine, talk to your doctor, pharmacist, or health care provider.  2013, Elsevier/Gold Standard. (11/11/2008 11:54:26 AM)   Carboplatin injection What is this medicine? CARBOPLATIN (KAR boe pla tin) is a chemotherapy drug. It targets fast dividing cells, like cancer cells, and causes these cells to die. This medicine is used to treat ovarian cancer and many other cancers. This medicine may be used for other purposes; ask your health care provider or pharmacist if you have questions. What should I   tell my health care provider before I take this medicine? They need to know if you have any of these conditions: -blood disorders -hearing problems -kidney disease -recent or ongoing radiation therapy -an unusual or allergic reaction to  carboplatin, cisplatin, other chemotherapy, other medicines, foods, dyes, or preservatives -pregnant or trying to get pregnant -breast-feeding How should I use this medicine? This drug is usually given as an infusion into a vein. It is administered in a hospital or clinic by a specially trained health care professional. Talk to your pediatrician regarding the use of this medicine in children. Special care may be needed. Overdosage: If you think you have taken too much of this medicine contact a poison control center or emergency room at once. NOTE: This medicine is only for you. Do not share this medicine with others. What if I miss a dose? It is important not to miss a dose. Call your doctor or health care professional if you are unable to keep an appointment. What may interact with this medicine? -medicines for seizures -medicines to increase blood counts like filgrastim, pegfilgrastim, sargramostim -some antibiotics like amikacin, gentamicin, neomycin, streptomycin, tobramycin -vaccines Talk to your doctor or health care professional before taking any of these medicines: -acetaminophen -aspirin -ibuprofen -ketoprofen -naproxen This list may not describe all possible interactions. Give your health care provider a list of all the medicines, herbs, non-prescription drugs, or dietary supplements you use. Also tell them if you smoke, drink alcohol, or use illegal drugs. Some items may interact with your medicine. What should I watch for while using this medicine? Your condition will be monitored carefully while you are receiving this medicine. You will need important blood work done while you are taking this medicine. This drug may make you feel generally unwell. This is not uncommon, as chemotherapy can affect healthy cells as well as cancer cells. Report any side effects. Continue your course of treatment even though you feel ill unless your doctor tells you to stop. In some cases, you may  be given additional medicines to help with side effects. Follow all directions for their use. Call your doctor or health care professional for advice if you get a fever, chills or sore throat, or other symptoms of a cold or flu. Do not treat yourself. This drug decreases your body's ability to fight infections. Try to avoid being around people who are sick. This medicine may increase your risk to bruise or bleed. Call your doctor or health care professional if you notice any unusual bleeding. Be careful brushing and flossing your teeth or using a toothpick because you may get an infection or bleed more easily. If you have any dental work done, tell your dentist you are receiving this medicine. Avoid taking products that contain aspirin, acetaminophen, ibuprofen, naproxen, or ketoprofen unless instructed by your doctor. These medicines may hide a fever. Do not become pregnant while taking this medicine. Women should inform their doctor if they wish to become pregnant or think they might be pregnant. There is a potential for serious side effects to an unborn child. Talk to your health care professional or pharmacist for more information. Do not breast-feed an infant while taking this medicine. What side effects may I notice from receiving this medicine? Side effects that you should report to your doctor or health care professional as soon as possible: -allergic reactions like skin rash, itching or hives, swelling of the face, lips, or tongue -signs of infection - fever or chills, cough, sore throat, pain   or difficulty passing urine -signs of decreased platelets or bleeding - bruising, pinpoint red spots on the skin, black, tarry stools, nosebleeds -signs of decreased red blood cells - unusually weak or tired, fainting spells, lightheadedness -breathing problems -changes in hearing -changes in vision -chest pain -high blood pressure -low blood counts - This drug may decrease the number of white blood  cells, red blood cells and platelets. You may be at increased risk for infections and bleeding. -nausea and vomiting -pain, swelling, redness or irritation at the injection site -pain, tingling, numbness in the hands or feet -problems with balance, talking, walking -trouble passing urine or change in the amount of urine Side effects that usually do not require medical attention (report to your doctor or health care professional if they continue or are bothersome): -hair loss -loss of appetite -metallic taste in the mouth or changes in taste This list may not describe all possible side effects. Call your doctor for medical advice about side effects. You may report side effects to FDA at 1-800-FDA-1088. Where should I keep my medicine? This drug is given in a hospital or clinic and will not be stored at home. NOTE: This sheet is a summary. It may not cover all possible information. If you have questions about this medicine, talk to your doctor, pharmacist, or health care provider.  2013, Elsevier/Gold Standard. (03/05/2008 2:38:05 PM)  

## 2013-09-21 ENCOUNTER — Telehealth: Payer: Self-pay

## 2013-09-21 NOTE — Telephone Encounter (Signed)
Spoke with [name] and she is doing great. Eating well.  No nausea, vomiting, or diarrhea.  She actually went Christmas shopping today. She knows to call [phone] if any problems arise.

## 2013-09-21 NOTE — Telephone Encounter (Signed)
Message copied by Lorine Bears on Fri Sep 21, 2013  1:00 PM ------      Message from: Corky Sing      Created: Thu Sep 20, 2013  1:13 PM      Regarding: Chemo follow up call      Contact: [phone]       First time Taxol/carboplatin. Dr. Bertis Ruddy patient. Please call.            Thanks,      Santina Evans, RN ------

## 2013-09-26 ENCOUNTER — Other Ambulatory Visit (HOSPITAL_BASED_OUTPATIENT_CLINIC_OR_DEPARTMENT_OTHER): Payer: Medicare Other | Admitting: Lab

## 2013-09-26 ENCOUNTER — Encounter: Payer: Self-pay | Admitting: Hematology and Oncology

## 2013-09-26 ENCOUNTER — Telehealth: Payer: Self-pay | Admitting: Hematology and Oncology

## 2013-09-26 ENCOUNTER — Encounter: Payer: Self-pay | Admitting: *Deleted

## 2013-09-26 ENCOUNTER — Ambulatory Visit (HOSPITAL_BASED_OUTPATIENT_CLINIC_OR_DEPARTMENT_OTHER): Payer: Medicare Other

## 2013-09-26 ENCOUNTER — Ambulatory Visit (HOSPITAL_BASED_OUTPATIENT_CLINIC_OR_DEPARTMENT_OTHER): Payer: Medicare Other | Admitting: Hematology and Oncology

## 2013-09-26 VITALS — BP 125/79 | HR 77 | Temp 96.9°F | Resp 20 | Ht 62.0 in | Wt 82.2 lb

## 2013-09-26 DIAGNOSIS — C329 Malignant neoplasm of larynx, unspecified: Secondary | ICD-10-CM

## 2013-09-26 DIAGNOSIS — F17200 Nicotine dependence, unspecified, uncomplicated: Secondary | ICD-10-CM

## 2013-09-26 DIAGNOSIS — Z5111 Encounter for antineoplastic chemotherapy: Secondary | ICD-10-CM

## 2013-09-26 DIAGNOSIS — C321 Malignant neoplasm of supraglottis: Secondary | ICD-10-CM

## 2013-09-26 DIAGNOSIS — D72829 Elevated white blood cell count, unspecified: Secondary | ICD-10-CM

## 2013-09-26 DIAGNOSIS — R64 Cachexia: Secondary | ICD-10-CM

## 2013-09-26 DIAGNOSIS — K5909 Other constipation: Secondary | ICD-10-CM

## 2013-09-26 DIAGNOSIS — R222 Localized swelling, mass and lump, trunk: Secondary | ICD-10-CM

## 2013-09-26 LAB — CBC WITH DIFFERENTIAL/PLATELET
Basophils Absolute: 0.1 10*3/uL (ref 0.0–0.1)
Eosinophils Absolute: 0 10*3/uL (ref 0.0–0.5)
HCT: 39.6 % (ref 34.8–46.6)
HGB: 13.2 g/dL (ref 11.6–15.9)
LYMPH%: 4.3 % — ABNORMAL LOW (ref 14.0–49.7)
MCH: 30.3 pg (ref 25.1–34.0)
MCHC: 33.4 g/dL (ref 31.5–36.0)
MONO#: 0.6 10*3/uL (ref 0.1–0.9)
NEUT%: 91.1 % — ABNORMAL HIGH (ref 38.4–76.8)
Platelets: 505 10*3/uL — ABNORMAL HIGH (ref 145–400)
RBC: 4.36 10*6/uL (ref 3.70–5.45)
RDW: 13.2 % (ref 11.2–14.5)
WBC: 15.5 10*3/uL — ABNORMAL HIGH (ref 3.9–10.3)
lymph#: 0.7 10*3/uL — ABNORMAL LOW (ref 0.9–3.3)

## 2013-09-26 LAB — COMPREHENSIVE METABOLIC PANEL (CC13)
ALT: 32 U/L (ref 0–55)
Albumin: 2.5 g/dL — ABNORMAL LOW (ref 3.5–5.0)
Alkaline Phosphatase: 73 U/L (ref 40–150)
Creatinine: 0.8 mg/dL (ref 0.6–1.1)
Glucose: 118 mg/dl (ref 70–140)
Potassium: 5.4 mEq/L — ABNORMAL HIGH (ref 3.5–5.1)
Sodium: 132 mEq/L — ABNORMAL LOW (ref 136–145)
Total Bilirubin: 0.27 mg/dL (ref 0.20–1.20)
Total Protein: 7.8 g/dL (ref 6.4–8.3)

## 2013-09-26 MED ORDER — DEXAMETHASONE SODIUM PHOSPHATE 20 MG/5ML IJ SOLN
INTRAMUSCULAR | Status: AC
  Filled 2013-09-26: qty 5

## 2013-09-26 MED ORDER — DIPHENHYDRAMINE HCL 50 MG/ML IJ SOLN
50.0000 mg | Freq: Once | INTRAMUSCULAR | Status: AC
  Administered 2013-09-26: 50 mg via INTRAVENOUS

## 2013-09-26 MED ORDER — SODIUM CHLORIDE 0.9 % IV SOLN
Freq: Once | INTRAVENOUS | Status: AC
  Administered 2013-09-26: 15:00:00 via INTRAVENOUS

## 2013-09-26 MED ORDER — HEPARIN SOD (PORK) LOCK FLUSH 100 UNIT/ML IV SOLN
500.0000 [IU] | Freq: Once | INTRAVENOUS | Status: AC | PRN
  Administered 2013-09-26: 500 [IU]
  Filled 2013-09-26: qty 5

## 2013-09-26 MED ORDER — PACLITAXEL CHEMO INJECTION 300 MG/50ML
80.0000 mg/m2 | Freq: Once | INTRAVENOUS | Status: AC
  Administered 2013-09-26: 102 mg via INTRAVENOUS
  Filled 2013-09-26: qty 17

## 2013-09-26 MED ORDER — DEXAMETHASONE SODIUM PHOSPHATE 20 MG/5ML IJ SOLN
20.0000 mg | Freq: Once | INTRAMUSCULAR | Status: AC
  Administered 2013-09-26: 20 mg via INTRAVENOUS

## 2013-09-26 MED ORDER — ONDANSETRON 16 MG/50ML IVPB (CHCC)
INTRAVENOUS | Status: AC
  Filled 2013-09-26: qty 16

## 2013-09-26 MED ORDER — FAMOTIDINE IN NACL 20-0.9 MG/50ML-% IV SOLN
INTRAVENOUS | Status: AC
  Filled 2013-09-26: qty 50

## 2013-09-26 MED ORDER — SODIUM CHLORIDE 0.9 % IV SOLN
101.0000 mg | Freq: Once | INTRAVENOUS | Status: AC
  Administered 2013-09-26: 100 mg via INTRAVENOUS
  Filled 2013-09-26: qty 10

## 2013-09-26 MED ORDER — DIPHENHYDRAMINE HCL 50 MG/ML IJ SOLN
INTRAMUSCULAR | Status: AC
  Filled 2013-09-26: qty 1

## 2013-09-26 MED ORDER — FAMOTIDINE IN NACL 20-0.9 MG/50ML-% IV SOLN
20.0000 mg | Freq: Once | INTRAVENOUS | Status: AC
  Administered 2013-09-26: 20 mg via INTRAVENOUS

## 2013-09-26 MED ORDER — SODIUM CHLORIDE 0.9 % IJ SOLN
10.0000 mL | INTRAMUSCULAR | Status: DC | PRN
  Administered 2013-09-26: 10 mL
  Filled 2013-09-26: qty 10

## 2013-09-26 MED ORDER — ONDANSETRON 16 MG/50ML IVPB (CHCC)
16.0000 mg | Freq: Once | INTRAVENOUS | Status: AC
  Administered 2013-09-26: 16 mg via INTRAVENOUS

## 2013-09-26 NOTE — Telephone Encounter (Signed)
Gave pt appt for lab Md and chemo for October 2014

## 2013-09-26 NOTE — Progress Notes (Signed)
Met with pt during scheduled infusion.  She expressed no concerns/needs.  Dtr and husband present for support.  Beginning navigation as L2 patient (treatments established).    Young Berry, RN, BSN, [facility] Navigator [phone]

## 2013-09-26 NOTE — Progress Notes (Signed)
[facility] OFFICE PROGRESS NOTE  Patient Care Team: Ezequiel Kayser, MD as PCP - General (Internal Medicine) Reola Calkins, MD (Radiology) Sharene Butters, MD as Attending Physician (Ophthalmology) Dennis Bast, RN as Registered Nurse (Oncology) Osborn Coho, MD as Consulting Physician (Otolaryngology)  DIAGNOSIS: Squamous cell carcinoma of the larynx, lung mass  SUMMARY OF ONCOLOGIC HISTORY: Oncology History   Laryngeal cancer   Primary site: Larynx - Supraglottis (Left)   Staging method: AJCC 7th Edition   Clinical: Stage IVC (T2, N1, M1) signed by Artis Delay, MD on 09/12/2013  9:43 PM   Pathologic: Stage IVC (T2, N1, M1) signed by Artis Delay, MD on 09/12/2013  9:43 PM   Summary: Stage IVC (T2, N1, M1)   Clinical comments:     Indeterminate lung mass, presumably related to cancer above. Cannot rule      out possibility of lung primary or metastatic kidney cancer       Laryngeal cancer   08/22/2013 Cancer Staging CT scan of neck showed large laryngeal mass   08/27/2013 Procedure FNA biopsy of larynx non-diagnostic but suspicious for cancer   09/07/2013 Surgery Direct laryngosocpy confirmed mass in the left pharyngeal wall and pyriform sinus. Biopsy confirmed invasive squamous cell carcinoma, p16 positive   09/11/2013 Cancer Staging PET/CT scan confirmed activity in larynx, LN involvement and a mass in the right lung   09/12/2013 Initial Diagnosis Laryngeal cancer    INTERVAL HISTORY: [name] 79 y.o. female returns for further followup prior to cycle 1, second week of treatment. She's doing well. She is to have poor appetite. Her weight is stable. She denies any nausea or vomiting. The patient is not taking her medications correctly. She's taking dexamethasone every day and Zofran 3 times a day throughout the last week. She has significant constipation requiring laxatives. Denies any worsening of her voice. No new lymphadenopathy. She still has some  mild cough but no sputum. She still smoking and not interested to quit.  I have reviewed the past medical history, past surgical history, social history and family history with the patient and they are unchanged from previous note.  ALLERGIES:  is allergic to tape.  MEDICATIONS:  Current Outpatient Prescriptions  Medication Sig Dispense Refill  . amLODipine (NORVASC) 5 MG tablet Take 1 tablet (5 mg total) by mouth daily.  90 tablet  3  . amoxicillin (AMOXIL) 875 MG tablet Take 875 mg by mouth daily.      Marland Kitchen aspirin 81 MG tablet Take 81 mg by mouth daily.        . beta carotene w/minerals (OCUVITE) tablet Take 1 tablet by mouth daily.        . bimatoprost (LUMIGAN) 0.03 % ophthalmic drops Place 1 drop into both eyes at bedtime.       . brimonidine (ALPHAGAN) 0.15 % ophthalmic solution Place 1 drop into both eyes daily.       . brinzolamide (AZOPT) 1 % ophthalmic suspension Place 1 drop into both eyes daily.       . ciprofloxacin-dexamethasone (CIPRODEX) otic suspension 4 drops 2 (two) times daily.      Marland Kitchen dexamethasone (DECADRON) 4 MG tablet Take 2 tablets (8 mg total) by mouth 2 (two) times daily with a meal. Start day after chemo for total 3 days each chemo cycle.  30 tablet  1  . ergocalciferol (VITAMIN D2) 50000 UNITS capsule Take 50,000 Units by mouth once a week. Pt expressed no particular day of  the week      . Fluticasone-Salmeterol (ADVAIR HFA IN) Inhale into the lungs as needed.        . Hydrocodone-Acetaminophen (LORTAB) 10-300 MG/15ML SOLN Take 5-10 mLs by mouth every 4 (four) hours as needed (Pain).  120 mL  0  . lidocaine-prilocaine (EMLA) cream Apply topically as needed.  30 g  1  . metoprolol tartrate (LOPRESSOR) 25 MG tablet Take 12.5 mg by mouth 2 (two) times daily.      . ondansetron (ZOFRAN) 8 MG tablet Take 1 tablet (8 mg total) by mouth every 8 (eight) hours as needed for nausea.  20 tablet  1  . prochlorperazine (COMPAZINE) 10 MG tablet Take 1 tablet (10 mg total) by mouth  every 6 (six) hours as needed (Nausea).  30 tablet  1  . rosuvastatin (CRESTOR) 20 MG tablet Take 1 tablet (20 mg total) by mouth daily.  90 tablet  3  . sodium chloride 0.9 % SOLN 1,000 mL with calcium chloride 10 % SOLN 8 g Inject 8 g into the vein as needed. YEARLY (SEPTEMBER)       . UNABLE TO FIND Med Name: Cranial Prothesis  1 each  0  . Zoledronic Acid (RECLAST IV) Inject into the vein See admin instructions. Once a year.  Had in Sept 2014.       No current facility-administered medications for this visit.    REVIEW OF SYSTEMS:   Constitutional: Denies fevers, chills or  Eyes: Denies blurriness of vision Ears, nose, mouth, throat, and face: Denies mucositis or sore throat Cardiovascular: Denies palpitation, chest discomfort or lower extremity swelling Gastrointestinal:  Denies nausea, heartburn or change in bowel habits Skin: Denies abnormal skin rashes Lymphatics: Denies new lymphadenopathy or easy bruising Neurological:Denies numbness, tingling or new weaknesses Behavioral/Psych: Mood is stable, no new changes  All other systems were reviewed with the patient and are negative.  PHYSICAL EXAMINATION: ECOG PERFORMANCE STATUS: 1 - Symptomatic but completely ambulatory  Filed Vitals:   09/26/13 1258  BP: 125/79  Pulse: 77  Temp: 96.9 F (36.1 C)  Resp: 20   Filed Weights   09/26/13 1258  Weight: 82 lb 3.2 oz (37.286 kg)    GENERAL:alert, no distress and comfortable she appears thin and cachectic SKIN: skin color, texture, turgor are normal, no rashes or significant lesions EYES: normal, Conjunctiva are pink and non-injected, sclera clear OROPHARYNX:no exudate, no erythema and lips, buccal mucosa, and tongue normal  NECK: supple, thyroid normal size, non-tender, without nodularity LYMPH:  no palpable lymphadenopathy in the cervical, axillary or inguinal LUNGS: clear to auscultation and percussion with normal breathing effort HEART: regular rate & rhythm and no murmurs  and no lower extremity edema ABDOMEN:abdomen soft, non-tender and normal bowel sounds Musculoskeletal:no cyanosis of digits and no clubbing  NEURO: alert & oriented x 3 with fluent speech but remain persistently hoarse  LABORATORY DATA:  I have reviewed the data as listed    Component Value Date/Time   NA 132* 09/26/2013 1239   NA 139 09/05/2013 1545   K 5.4* 09/26/2013 1239   K 4.5 09/05/2013 1545   CL 104 09/05/2013 1545   CO2 27 09/26/2013 1239   CO2 27 09/05/2013 1545   GLUCOSE 118 09/26/2013 1239   GLUCOSE 90 09/05/2013 1545   BUN 31.4* 09/26/2013 1239   BUN 11 09/05/2013 1545   CREATININE 0.8 09/26/2013 1239   CREATININE 0.67 09/05/2013 1545   CREATININE 0.64 06/27/2013 1138   CALCIUM 8.8 09/26/2013 1239  CALCIUM 8.7 09/05/2013 1545   PROT 7.8 09/26/2013 1239   PROT 6.0 01/12/2011 0431   ALBUMIN 2.5* 09/26/2013 1239   ALBUMIN 3.0* 01/12/2011 0431   AST 24 09/26/2013 1239   AST 15 01/12/2011 0431   ALT 32 09/26/2013 1239   ALT 11 01/12/2011 0431   ALKPHOS 73 09/26/2013 1239   ALKPHOS 56 01/12/2011 0431   BILITOT 0.27 09/26/2013 1239   BILITOT 0.7 01/12/2011 0431   GFRNONAA 80* 09/05/2013 1545   GFRAA >90 09/05/2013 1545    No results found for this basename: SPEP,  UPEP,   kappa and lambda light chains    Lab Results  Component Value Date   WBC 15.5* 09/26/2013   NEUTROABS 14.1* 09/26/2013   HGB 13.2 09/26/2013   HCT 39.6 09/26/2013   MCV 90.8 09/26/2013   PLT 505* 09/26/2013      Chemistry      Component Value Date/Time   NA 132* 09/26/2013 1239   NA 139 09/05/2013 1545   K 5.4* 09/26/2013 1239   K 4.5 09/05/2013 1545   CL 104 09/05/2013 1545   CO2 27 09/26/2013 1239   CO2 27 09/05/2013 1545   BUN 31.4* 09/26/2013 1239   BUN 11 09/05/2013 1545   CREATININE 0.8 09/26/2013 1239   CREATININE 0.67 09/05/2013 1545   CREATININE 0.64 06/27/2013 1138      Component Value Date/Time   CALCIUM 8.8 09/26/2013 1239   CALCIUM 8.7 09/05/2013 1545   ALKPHOS 73 09/26/2013 1239    ALKPHOS 56 01/12/2011 0431   AST 24 09/26/2013 1239   AST 15 01/12/2011 0431   ALT 32 09/26/2013 1239   ALT 11 01/12/2011 0431   BILITOT 0.27 09/26/2013 1239   BILITOT 0.7 01/12/2011 0431      ASSESSMENT:  Laryngeal cancer  PLAN:  #1 laryngeal cancer We'll continue treatments without dose adjustment. I will see her on a weekly basis due to her age and other comorbidities. Overall she is tolerating treatment well. #2 cough This is likely due to her COPD. She was prescribed antibiotics recently. We'll continue. #3 mild leukocytosis I suspect this is due to her dexamethasone which she has been taking incorrectly. I have asked her to stop the dexamethasone. #4 constipation This is due to the fact she's been taking Zofran around the clock even though the bottle suggested to be taken as needed. Again I spent some time educating the patient and her husband about the correct way to take her medications. #5 cachexia Continue to encourage her to increase oral intake. #6 poor hearing Am wondering whether this could be a contributing factor why she is taking the medication incorrectly. I tried to reinforce education today #7 smoking I spent a lot of time educating the patient about the importance of nicotine cessation. She's not interested to quit.  Orders Placed This Encounter  Procedures  . CBC with Differential    Standing Status: Future     Number of Occurrences:      Standing Expiration Date: 06/18/2014  . Comprehensive metabolic panel    Standing Status: Future     Number of Occurrences:      Standing Expiration Date: 09/26/2014    Order Specific Question:  Has the patient fasted?    Answer:  No   All questions were answered. The patient knows to call the clinic with any problems, questions or concerns. No barriers to learning was detected.    March Steyer, MD 09/26/2013 1:27 PM

## 2013-09-26 NOTE — Patient Instructions (Signed)
[facility] Discharge Instructions for Patients Receiving Chemotherapy  Today you received the following chemotherapy agents: Carboplatin, Taxol.  To help prevent nausea and vomiting after your treatment, we encourage you to take your nausea medication.    If you develop nausea and vomiting that is not controlled by your nausea medication, call the clinic.   BELOW ARE SYMPTOMS THAT SHOULD BE REPORTED IMMEDIATELY:  *FEVER GREATER THAN 100.5 F  *CHILLS WITH OR WITHOUT FEVER  NAUSEA AND VOMITING THAT IS NOT CONTROLLED WITH YOUR NAUSEA MEDICATION  *UNUSUAL SHORTNESS OF BREATH  *UNUSUAL BRUISING OR BLEEDING  TENDERNESS IN MOUTH AND THROAT WITH OR WITHOUT PRESENCE OF ULCERS  *URINARY PROBLEMS  *BOWEL PROBLEMS  UNUSUAL RASH Items with * indicate a potential emergency and should be followed up as soon as possible.  Feel free to call the clinic you have any questions or concerns. The clinic phone number is [phone].    

## 2013-09-28 ENCOUNTER — Encounter (HOSPITAL_COMMUNITY): Payer: Self-pay | Admitting: Emergency Medicine

## 2013-09-28 ENCOUNTER — Emergency Department (HOSPITAL_COMMUNITY): Payer: Medicare Other

## 2013-09-28 ENCOUNTER — Inpatient Hospital Stay (HOSPITAL_COMMUNITY)
Admission: EM | Admit: 2013-09-28 | Discharge: 2013-10-03 | DRG: 371 | Disposition: A | Discharge status: Hospice | Payer: Medicare Other | Attending: Internal Medicine | Admitting: Internal Medicine

## 2013-09-28 DIAGNOSIS — R0989 Other specified symptoms and signs involving the circulatory and respiratory systems: Secondary | ICD-10-CM | POA: Diagnosis present

## 2013-09-28 DIAGNOSIS — I739 Peripheral vascular disease, unspecified: Secondary | ICD-10-CM | POA: Diagnosis present

## 2013-09-28 DIAGNOSIS — F411 Generalized anxiety disorder: Secondary | ICD-10-CM

## 2013-09-28 DIAGNOSIS — R64 Cachexia: Secondary | ICD-10-CM | POA: Diagnosis present

## 2013-09-28 DIAGNOSIS — R112 Nausea with vomiting, unspecified: Secondary | ICD-10-CM

## 2013-09-28 DIAGNOSIS — I714 Abdominal aortic aneurysm, without rupture, unspecified: Secondary | ICD-10-CM

## 2013-09-28 DIAGNOSIS — R197 Diarrhea, unspecified: Secondary | ICD-10-CM

## 2013-09-28 DIAGNOSIS — J438 Other emphysema: Secondary | ICD-10-CM

## 2013-09-28 DIAGNOSIS — K529 Noninfective gastroenteritis and colitis, unspecified: Secondary | ICD-10-CM

## 2013-09-28 DIAGNOSIS — Z720 Tobacco use: Secondary | ICD-10-CM

## 2013-09-28 DIAGNOSIS — R52 Pain, unspecified: Secondary | ICD-10-CM

## 2013-09-28 DIAGNOSIS — R109 Unspecified abdominal pain: Secondary | ICD-10-CM

## 2013-09-28 DIAGNOSIS — I10 Essential (primary) hypertension: Secondary | ICD-10-CM

## 2013-09-28 DIAGNOSIS — C329 Malignant neoplasm of larynx, unspecified: Secondary | ICD-10-CM

## 2013-09-28 DIAGNOSIS — A0472 Enterocolitis due to Clostridium difficile, not specified as recurrent: Principal | ICD-10-CM | POA: Diagnosis present

## 2013-09-28 DIAGNOSIS — K56609 Unspecified intestinal obstruction, unspecified as to partial versus complete obstruction: Secondary | ICD-10-CM | POA: Diagnosis present

## 2013-09-28 DIAGNOSIS — Z681 Body mass index (BMI) 19 or less, adult: Secondary | ICD-10-CM

## 2013-09-28 DIAGNOSIS — E7800 Pure hypercholesterolemia, unspecified: Secondary | ICD-10-CM | POA: Diagnosis present

## 2013-09-28 DIAGNOSIS — Z515 Encounter for palliative care: Secondary | ICD-10-CM

## 2013-09-28 DIAGNOSIS — Z85528 Personal history of other malignant neoplasm of kidney: Secondary | ICD-10-CM

## 2013-09-28 DIAGNOSIS — R627 Adult failure to thrive: Secondary | ICD-10-CM

## 2013-09-28 DIAGNOSIS — J449 Chronic obstructive pulmonary disease, unspecified: Secondary | ICD-10-CM

## 2013-09-28 DIAGNOSIS — E871 Hypo-osmolality and hyponatremia: Secondary | ICD-10-CM

## 2013-09-28 DIAGNOSIS — Z7982 Long term (current) use of aspirin: Secondary | ICD-10-CM

## 2013-09-28 DIAGNOSIS — I4891 Unspecified atrial fibrillation: Secondary | ICD-10-CM | POA: Diagnosis present

## 2013-09-28 DIAGNOSIS — F17200 Nicotine dependence, unspecified, uncomplicated: Secondary | ICD-10-CM | POA: Diagnosis present

## 2013-09-28 DIAGNOSIS — E43 Unspecified severe protein-calorie malnutrition: Secondary | ICD-10-CM

## 2013-09-28 DIAGNOSIS — Z79899 Other long term (current) drug therapy: Secondary | ICD-10-CM

## 2013-09-28 DIAGNOSIS — R1032 Left lower quadrant pain: Secondary | ICD-10-CM

## 2013-09-28 DIAGNOSIS — E876 Hypokalemia: Secondary | ICD-10-CM | POA: Diagnosis present

## 2013-09-28 DIAGNOSIS — Z66 Do not resuscitate: Secondary | ICD-10-CM | POA: Diagnosis not present

## 2013-09-28 DIAGNOSIS — Z905 Acquired absence of kidney: Secondary | ICD-10-CM

## 2013-09-28 DIAGNOSIS — B370 Candidal stomatitis: Secondary | ICD-10-CM

## 2013-09-28 DIAGNOSIS — H409 Unspecified glaucoma: Secondary | ICD-10-CM | POA: Diagnosis present

## 2013-09-28 DIAGNOSIS — R000 Tachycardia, unspecified: Secondary | ICD-10-CM | POA: Diagnosis present

## 2013-09-28 DIAGNOSIS — R0600 Dyspnea, unspecified: Secondary | ICD-10-CM

## 2013-09-28 DIAGNOSIS — E861 Hypovolemia: Secondary | ICD-10-CM | POA: Diagnosis present

## 2013-09-28 DIAGNOSIS — R0609 Other forms of dyspnea: Secondary | ICD-10-CM | POA: Diagnosis present

## 2013-09-28 DIAGNOSIS — J4489 Other specified chronic obstructive pulmonary disease: Secondary | ICD-10-CM | POA: Diagnosis present

## 2013-09-28 LAB — COMPREHENSIVE METABOLIC PANEL WITH GFR
ALT: 30 U/L (ref 0–35)
AST: 21 U/L (ref 0–37)
Albumin: 2.5 g/dL — ABNORMAL LOW (ref 3.5–5.2)
Alkaline Phosphatase: 80 U/L (ref 39–117)
BUN: 27 mg/dL — ABNORMAL HIGH (ref 6–23)
CO2: 20 meq/L (ref 19–32)
Calcium: 8.1 mg/dL — ABNORMAL LOW (ref 8.4–10.5)
Chloride: 97 meq/L (ref 96–112)
Creatinine, Ser: 0.51 mg/dL (ref 0.50–1.10)
GFR calc Af Amer: >90 mL/min
GFR calc non Af Amer: 87 mL/min — ABNORMAL LOW
Glucose, Bld: 124 mg/dL — ABNORMAL HIGH (ref 70–99)
Potassium: 4 meq/L (ref 3.5–5.1)
Sodium: 127 meq/L — ABNORMAL LOW (ref 135–145)
Total Bilirubin: 0.4 mg/dL (ref 0.3–1.2)
Total Protein: 6.8 g/dL (ref 6.0–8.3)

## 2013-09-28 LAB — CBC WITH DIFFERENTIAL/PLATELET
Basophils Absolute: 0 10*3/uL (ref 0.0–0.1)
Eosinophils Absolute: 0 10*3/uL (ref 0.0–0.7)
Eosinophils Relative: 0 % (ref 0–5)
HCT: 42.6 % (ref 36.0–46.0)
Hemoglobin: 14.7 g/dL (ref 12.0–15.0)
Lymphs Abs: 0.4 10*3/uL — ABNORMAL LOW (ref 0.7–4.0)
MCH: 30.6 pg (ref 26.0–34.0)
MCHC: 34.5 g/dL (ref 30.0–36.0)
MCV: 88.6 fL (ref 78.0–100.0)
Monocytes Absolute: 0.1 10*3/uL (ref 0.1–1.0)
Monocytes Relative: 2 % — ABNORMAL LOW (ref 3–12)
Neutrophils Relative %: 87 % — ABNORMAL HIGH (ref 43–77)
Platelets: 365 10*3/uL (ref 150–400)
RBC: 4.81 MIL/uL (ref 3.87–5.11)

## 2013-09-28 LAB — URINALYSIS, ROUTINE W REFLEX MICROSCOPIC
Bilirubin Urine: NEGATIVE
Glucose, UA: NEGATIVE mg/dL
Hgb urine dipstick: NEGATIVE
Ketones, ur: NEGATIVE mg/dL
Protein, ur: 30 mg/dL — AB
Urobilinogen, UA: 0.2 mg/dL (ref 0.0–1.0)

## 2013-09-28 LAB — LACTIC ACID, PLASMA: Lactic Acid, Venous: 1.1 mmol/L (ref 0.5–2.2)

## 2013-09-28 LAB — URINE MICROSCOPIC-ADD ON

## 2013-09-28 MED ORDER — ACETAMINOPHEN 325 MG PO TABS: 650.0000 mg | ORAL_TABLET | Freq: Four times a day (QID) | ORAL | Status: DC | PRN

## 2013-09-28 MED ORDER — MORPHINE SULFATE 2 MG/ML IJ SOLN
2.0000 mg | Freq: Once | INTRAMUSCULAR | Status: AC
  Administered 2013-09-28: 2 mg via INTRAVENOUS
  Filled 2013-09-28: qty 1

## 2013-09-28 MED ORDER — IOHEXOL 300 MG/ML  SOLN
60.0000 mL | Freq: Once | INTRAMUSCULAR | Status: AC | PRN
  Administered 2013-09-28: 60 mL via INTRAVENOUS

## 2013-09-28 MED ORDER — OCUVITE PO TABS
1.0000 | ORAL_TABLET | Freq: Every day | ORAL | Status: DC
  Administered 2013-09-28 – 2013-09-29 (×2): 1 via ORAL
  Filled 2013-09-28 (×4): qty 1

## 2013-09-28 MED ORDER — HYDROMORPHONE HCL PF 1 MG/ML IJ SOLN
1.0000 mg | INTRAMUSCULAR | Status: DC | PRN
  Administered 2013-09-28 – 2013-10-01 (×3): 1 mg via INTRAVENOUS
  Filled 2013-09-28 (×3): qty 1

## 2013-09-28 MED ORDER — SODIUM CHLORIDE 0.9 % IV SOLN
INTRAVENOUS | Status: DC
  Administered 2013-09-29 – 2013-10-01 (×3): via INTRAVENOUS

## 2013-09-28 MED ORDER — METRONIDAZOLE IN NACL 5-0.79 MG/ML-% IV SOLN
500.0000 mg | Freq: Three times a day (TID) | INTRAVENOUS | Status: DC
  Administered 2013-09-28 – 2013-10-01 (×8): 500 mg via INTRAVENOUS
  Filled 2013-09-28 (×10): qty 100

## 2013-09-28 MED ORDER — MORPHINE SULFATE 2 MG/ML IJ SOLN: 2.0000 mg | INTRAMUSCULAR | Status: DC | PRN

## 2013-09-28 MED ORDER — LATANOPROST 0.005 % OP SOLN
1.0000 [drp] | Freq: Every day | OPHTHALMIC | Status: DC
  Administered 2013-09-28 – 2013-10-02 (×5): 1 [drp] via OPHTHALMIC
  Filled 2013-09-28: qty 2.5

## 2013-09-28 MED ORDER — IOHEXOL 300 MG/ML  SOLN
50.0000 mL | Freq: Once | INTRAMUSCULAR | Status: AC | PRN
  Administered 2013-09-28: 50 mL via ORAL

## 2013-09-28 MED ORDER — ONDANSETRON HCL 4 MG/2ML IJ SOLN: 4.0000 mg | Freq: Three times a day (TID) | INTRAMUSCULAR | Status: AC | PRN

## 2013-09-28 MED ORDER — ACETAMINOPHEN 650 MG RE SUPP: 650.0000 mg | Freq: Four times a day (QID) | RECTAL | Status: DC | PRN

## 2013-09-28 MED ORDER — METOPROLOL TARTRATE 25 MG PO TABS
25.0000 mg | ORAL_TABLET | Freq: Every day | ORAL | Status: DC
  Administered 2013-09-28 – 2013-09-30 (×3): 25 mg via ORAL
  Filled 2013-09-28 (×5): qty 1

## 2013-09-28 MED ORDER — SODIUM CHLORIDE 0.9 % IV BOLUS (SEPSIS)
500.0000 mL | Freq: Once | INTRAVENOUS | Status: AC
  Administered 2013-09-28: 500 mL via INTRAVENOUS

## 2013-09-28 MED ORDER — HEPARIN SODIUM (PORCINE) 5000 UNIT/ML IJ SOLN
5000.0000 [IU] | Freq: Three times a day (TID) | INTRAMUSCULAR | Status: DC
  Administered 2013-09-28 – 2013-10-01 (×8): 5000 [IU] via SUBCUTANEOUS
  Filled 2013-09-28 (×11): qty 1

## 2013-09-28 MED ORDER — LEVALBUTEROL HCL 0.63 MG/3ML IN NEBU: 0.6300 mg | INHALATION_SOLUTION | Freq: Four times a day (QID) | RESPIRATORY_TRACT | Status: DC | PRN

## 2013-09-28 MED ORDER — HYDROCODONE-ACETAMINOPHEN 5-325 MG PO TABS
1.0000 | ORAL_TABLET | ORAL | Status: DC | PRN
  Administered 2013-09-28 – 2013-09-30 (×3): 1 via ORAL
  Filled 2013-09-28 (×3): qty 1

## 2013-09-28 MED ORDER — RESOURCE INSTANT PROTEIN PO PWD PACKET
1.0000 | Freq: Three times a day (TID) | ORAL | Status: DC
  Administered 2013-09-28 – 2013-09-30 (×4): 6 g via ORAL
  Filled 2013-09-28: qty 227

## 2013-09-28 MED ORDER — BOOST / RESOURCE BREEZE PO LIQD
1.0000 | Freq: Three times a day (TID) | ORAL | Status: DC
  Administered 2013-09-28 – 2013-09-30 (×6): 1 via ORAL

## 2013-09-28 MED ORDER — SODIUM CHLORIDE 0.9 % IV SOLN
INTRAVENOUS | Status: DC
  Administered 2013-09-28: 08:00:00 via INTRAVENOUS
  Administered 2013-09-28: 75 mL via INTRAVENOUS

## 2013-09-28 MED ORDER — GUAIFENESIN-DM 100-10 MG/5ML PO SYRP: 5.0000 mL | ORAL_SOLUTION | ORAL | Status: DC | PRN

## 2013-09-28 MED ORDER — SODIUM CHLORIDE 0.9 % IJ SOLN
3.0000 mL | Freq: Two times a day (BID) | INTRAMUSCULAR | Status: DC
  Administered 2013-09-29 – 2013-10-01 (×3): 3 mL via INTRAVENOUS

## 2013-09-28 MED ORDER — CIPROFLOXACIN IN D5W 400 MG/200ML IV SOLN
400.0000 mg | Freq: Two times a day (BID) | INTRAVENOUS | Status: DC
  Administered 2013-09-28 – 2013-09-29 (×2): 400 mg via INTRAVENOUS
  Filled 2013-09-28 (×4): qty 200

## 2013-09-28 MED ORDER — CIPROFLOXACIN-DEXAMETHASONE 0.3-0.1 % OT SUSP
2.0000 [drp] | Freq: Two times a day (BID) | OTIC | Status: DC
Start: 2013-09-28 — End: 2013-10-03
  Administered 2013-09-28 – 2013-09-29 (×2): 2 [drp] via OTIC
  Filled 2013-09-28: qty 7.5

## 2013-09-28 NOTE — Progress Notes (Addendum)
INITIAL NUTRITION ASSESSMENT  DOCUMENTATION CODES Per approved criteria  -Severe malnutrition in the context of chronic illness -Underweight  Pt meets criteria for SEVERE MALNUTRITION in the context of Chronic as evidenced by greater than 8% wt loss in less than 3 months, severe muscle wasting evidenced in physical exam, and estimated energy intake <75% of estimated energy needs for > 1 month.    INTERVENTION: Provide Resource Breeze TID in between meals, each supplement provides 250 kcal and 9 grams of protein.  Provide Beneprotein TID with meals Change supplement to Ensure Complete TID when diet advanced   NUTRITION DIAGNOSIS: Inadequate oral intake related to poor appetite as evidenced by 5% wt loss in the past month and >8% wt loss in less than 3 months.   Goal: Pt to meet >/= 90% of their estimated nutrition needs    Monitor:  Diet advancement PO intake Weight Labs  Reason for Assessment: Malnutrition Screening Tool, score of 3  79 y.o. female  Admitting Dx: Enteritis  ASSESSMENT: 79 year old female who was recently diagnosed with laryngeal cancer, last month, known history of AAA, nicotine abuse, history of renal cancer s/p nephrectomy presented to [facility] year with severe abdominal pain with diarrhea this morning. Patient was recently diagnosed with laryngeal cancer, started chemotherapy last month with Dr. Bertis Ruddy (Oncology), seen by oncology on 09/26/2013. 79 year old female who was recently diagnosed with laryngeal cancer, last month, known history of AAA, nicotine abuse, history of renal cancer s/p nephrectomy presented to [facility] year with severe abdominal pain with diarrhea this morning. Patient was recently diagnosed with laryngeal cancer, started chemotherapy last month with Dr. Bertis Ruddy (Oncology), seen by oncology on 09/26/2013.   Pt reports that she was drinking 2-3 Ensure supplements daily PTA. Pt's husband states he was preparing 3 meals daily but, pt  was not eating much. Pt reports poor appetite but, denies swallowing difficulty. Pt currently on clear liquid diet. Weight history shows 14% wt loss from 07/18/13 to today and 5% wt loss in the past month. Pt is 9% below her usual body weight.   Nutrition Focused Physical Exam:  Subcutaneous Fat:  Orbital Region: mild wasting Upper Arm Region: severe wasting Thoracic and Lumbar Region: NA  Muscle:  Temple Region: severe wasting Clavicle Bone Region: severe wasting Clavicle and Acromion Bone Region: severe wasting Scapular Bone Region: NA Dorsal Hand: moderate/severe wasting Patellar Region: NA Anterior Thigh Region: NA Posterior Calf Region: NA  Edema: none  Height: Ht Readings from Last 1 Encounters:  09/28/13 5\' 2"  (1.575 m)    Weight: Wt Readings from Last 1 Encounters:  09/28/13 83 lb 1.8 oz (37.7 kg)    Ideal Body Weight: 110 lbs  % Ideal Body Weight: 75%  Wt Readings from Last 10 Encounters:  09/28/13 83 lb 1.8 oz (37.7 kg)  09/26/13 82 lb 3.2 oz (37.286 kg)  09/13/13 82 lb (37.195 kg)  09/12/13 82 lb 4.8 oz (37.331 kg)  09/05/13 85 lb 14.4 oz (38.964 kg)  08/30/13 87 lb 3.2 oz (39.554 kg)  07/18/13 96 lb (43.545 kg)  05/03/13 91 lb (41.277 kg)  11/28/12 90 lb 3.2 oz (40.914 kg)  10/27/12 90 lb 3.2 oz (40.914 kg)    Usual Body Weight: 91 lbs (May 2014)  % Usual Body Weight: 92%  BMI:  Body mass index is 15.2 kg/(m^2).  Estimated Nutritional Needs: Kcal: 1300-1500 Protein: 56-64 grams Fluid: >/= 1.1 L/day  Skin: incision on left foot  Diet Order: Clear Liquid  EDUCATION NEEDS: -No  education needs identified at this time  No intake or output data in the 24 hours ending 09/28/13 1534  Last BM: 10/17  Labs:   Recent Labs Lab 09/26/13 1239 09/28/13 0746  NA 132* 127*  K 5.4* 4.0  CL  --  97  CO2 27 20  BUN 31.4* 27*  CREATININE 0.8 0.51  CALCIUM 8.8 8.1*  GLUCOSE 118 124*    CBG (last 3)  No results found for this basename:  GLUCAP,  in the last 72 hours  Scheduled Meds: . beta carotene w/minerals  1 tablet Oral QHS  . ciprofloxacin  400 mg Intravenous Q12H  . ciprofloxacin-dexamethasone  2 drop Right Ear BID  . heparin  5,000 Units Subcutaneous Q8H  . latanoprost  1 drop Both Eyes QHS  . metoprolol tartrate  25 mg Oral Daily  . metronidazole  500 mg Intravenous Q8H  . sodium chloride  3 mL Intravenous Q12H    Continuous Infusions: . sodium chloride 75 mL/hr at 09/28/13 1400  . sodium chloride      Past Medical History  Diagnosis Date  . Aortic atherosclerosis   . PAD (peripheral artery disease)   . COPD (chronic obstructive pulmonary disease)   . HTN (hypertension)   . Hypercholesteremia   . Glaucoma   . Bronchitis   . Fractured coccyx   . AAA (abdominal aortic aneurysm)   . PAD (peripheral artery disease)   . Renal cell carcinoma 1995    Kidney removal  . Tobacco abuse 10/27/2012  . Laryngeal cancer 09/12/2013    Past Surgical History  Procedure Laterality Date  . Nephrectomy      LEFT/CANCER  . Renal cryoablation      Right with radiation  . Hip fracture surgery  2009  . Tonsillectomy    . Breast surgery      breast implants  . Colonoscopy    . Direct laryngoscopy N/A 09/07/2013    Procedure: DIRECT LARYNGOSCOPY AND DIRECT LARYNGEAL BIOPSIES;  Surgeon: Osborn Coho, MD;  Location: [facility] OR;  Service: ENT;  Laterality: N/A;    Ian Malkin RD, LDN Inpatient Clinical Dietitian Pager: [phone] After Hours Pager: [phone]

## 2013-09-28 NOTE — ED Provider Notes (Signed)
CSN: [id number]     Arrival date & time 09/28/13  4098 History   First MD Initiated Contact with Patient 09/28/13 [phone]     No chief complaint on file.  (Consider location/radiation/quality/duration/timing/severity/associated sxs/prior Treatment) HPI Patient reports she was recently diagnosed with laryngeal cancer. She received her second chemotherapy treatment 2 days ago. The first treatment was 9 days ago. Patient started getting abdominal pain last night. She indicates it's across her lower abdomen. She states it comes and goes about every 10-15 minutes and lasts a couple minutes. She states it gets very hard and then goes away. She had nausea and did have one episode of vomiting this morning. She has not had a bowel movement in over a week and her husband gave her 2 Dulcolax at 3 AM this morning after which she had 2 very loose stools. She denies any fever, she denies nausea now. She denies any dizziness but she did feel weak yesterday. She's never had this pain before. She denies any trouble swallowing and husband states she never eats a lot. He reports she's had about a 10 pound weight loss in the past 6 months. He estimates her current weight is about 82 pounds. Patient was also noted to have a very wet sounding cough and he states she's been coughing for 2 months. There was some question about her medications. Husband states they gave her the medication "as written". However it's not clear which medicine they were taking. She states she was taking the medicine 3 times a day, he states she was taking it every 4 hours. They seem to indicate it may have been the Zofran. They discussed this with her oncologist who thought that was the cause of her constipation and they stopped it 2 days ago. Patient does have liquid pain medicine written however she states she is not taking any liquid medicine. Husband looked at the dexamethasone instructions and he does not think she has been taking it.  PCP Dr  Waynard Edwards Oncology Dr Bertis Ruddy  Past Medical History  Diagnosis Date  . Aortic atherosclerosis   . PAD (peripheral artery disease)   . COPD (chronic obstructive pulmonary disease)   . HTN (hypertension)   . Hypercholesteremia   . Glaucoma   . Bronchitis   . Fractured coccyx   . AAA (abdominal aortic aneurysm)   . PAD (peripheral artery disease)   . Renal cell carcinoma 1995    Kidney removal  . Tobacco abuse 10/27/2012  . Laryngeal cancer 09/12/2013   Past Surgical History  Procedure Laterality Date  . Nephrectomy      LEFT/CANCER  . Renal cryoablation      Right with radiation  . Hip fracture surgery  2009  . Tonsillectomy    . Breast surgery      breast implants  . Colonoscopy    . Direct laryngoscopy N/A 09/07/2013    Procedure: DIRECT LARYNGOSCOPY AND DIRECT LARYNGEAL BIOPSIES;  Surgeon: Osborn Coho, MD;  Location: [facility] OR;  Service: ENT;  Laterality: N/A;   Family History  Problem Relation Age of Onset  . Heart attack Father   . Hypertension Father   . Heart disease Father   . Hypertension Mother   . Cancer Mother     MOUTH,BREAST,SPINE  . Diabetes Daughter   . Hypertension Daughter    History  Substance Use Topics  . Smoking status: Current Every Day Smoker -- 0.50 packs/day for 65 years    Types: Cigarettes  . Smokeless tobacco: Never  Used  . Alcohol Use: Yes     Comment: occasional/socially   Lives at home Lives with spouse Smokes 1/2 ppd  OB History   Grav Para Term Preterm Abortions TAB SAB Ect Mult Living                 Review of Systems  All other systems reviewed and are negative.    Allergies  Tape  Home Medications   Current Outpatient Rx  Name  Route  Sig  Dispense  Refill  . amLODipine (NORVASC) 5 MG tablet   Oral   Take 1 tablet (5 mg total) by mouth daily.   90 tablet   3   . amoxicillin (AMOXIL) 875 MG tablet   Oral   Take 875 mg by mouth daily.         Marland Kitchen aspirin 81 MG tablet   Oral   Take 81 mg by mouth daily.            . beta carotene w/minerals (OCUVITE) tablet   Oral   Take 1 tablet by mouth daily.           . bimatoprost (LUMIGAN) 0.03 % ophthalmic drops   Both Eyes   Place 1 drop into both eyes at bedtime.          . brimonidine (ALPHAGAN) 0.15 % ophthalmic solution   Both Eyes   Place 1 drop into both eyes daily.          . brinzolamide (AZOPT) 1 % ophthalmic suspension   Both Eyes   Place 1 drop into both eyes daily.          . ciprofloxacin-dexamethasone (CIPRODEX) otic suspension      4 drops 2 (two) times daily.         Marland Kitchen dexamethasone (DECADRON) 4 MG tablet   Oral   Take 2 tablets (8 mg total) by mouth 2 (two) times daily with a meal. Start day after chemo for total 3 days each chemo cycle.   30 tablet   1   . ergocalciferol (VITAMIN D2) 50000 UNITS capsule   Oral   Take 50,000 Units by mouth once a week. Pt expressed no particular day of the week         . Fluticasone-Salmeterol (ADVAIR HFA IN)   Inhalation   Inhale into the lungs as needed.           . Hydrocodone-Acetaminophen (LORTAB) 10-300 MG/15ML SOLN   Oral   Take 5-10 mLs by mouth every 4 (four) hours as needed (Pain).   120 mL   0   . lidocaine-prilocaine (EMLA) cream   Topical   Apply topically as needed.   30 g   1   . metoprolol tartrate (LOPRESSOR) 25 MG tablet   Oral   Take 12.5 mg by mouth 2 (two) times daily.         . ondansetron (ZOFRAN) 8 MG tablet   Oral   Take 1 tablet (8 mg total) by mouth every 8 (eight) hours as needed for nausea.   20 tablet   1   . prochlorperazine (COMPAZINE) 10 MG tablet   Oral   Take 1 tablet (10 mg total) by mouth every 6 (six) hours as needed (Nausea).   30 tablet   1   . rosuvastatin (CRESTOR) 20 MG tablet   Oral   Take 1 tablet (20 mg total) by mouth daily.   90 tablet   3   .  sodium chloride 0.9 % SOLN 1,000 mL with calcium chloride 10 % SOLN 8 g   Intravenous   Inject 8 g into the vein as needed. YEARLY (SEPTEMBER)           . UNABLE TO FIND      Med Name: Cranial Prothesis   1 each   0   . Zoledronic Acid (RECLAST IV)   Intravenous   Inject into the vein See admin instructions. Once a year.  Had in Sept 2014.          BP 155/82  Pulse 118  Temp(Src) 97.5 F (36.4 C) (Oral)  Resp 16  SpO2 99%  Vital signs normal except tachycardia  Physical Exam  Nursing note and vitals reviewed. Constitutional: She is oriented to person, place, and time.  Non-toxic appearance. She does not appear ill. No distress.  Frail elderly female  HENT:  Head: Normocephalic and atraumatic.  Right Ear: External ear normal.  Left Ear: External ear normal.  Nose: Nose normal. No mucosal edema or rhinorrhea.  Mouth/Throat: Oropharynx is clear and moist and mucous membranes are normal. No dental abscesses or uvula swelling.  Sunken eyes, sunken cheeks  Eyes: Conjunctivae and EOM are normal. Pupils are equal, round, and reactive to light.  Neck: Normal range of motion and full passive range of motion without pain. Neck supple.  Cardiovascular: Normal rate, regular rhythm and normal heart sounds.  Exam reveals no gallop and no friction rub.   No murmur heard. Pulmonary/Chest: Effort normal and breath sounds normal. No respiratory distress. She has no wheezes. She has no rhonchi. She has no rales. She exhibits no tenderness and no crepitus.  Deep rattling cough, otherwise clear.   Abdominal: Soft. Normal appearance and bowel sounds are normal. She exhibits no distension. There is tenderness. There is no rebound and no guarding.    Musculoskeletal: Normal range of motion. She exhibits no edema and no tenderness.  Moves all extremities well.   Neurological: She is alert and oriented to person, place, and time. She has normal strength. No cranial nerve deficit.  Skin: Skin is warm, dry and intact. No rash noted. No erythema. No pallor.  Psychiatric: She has a normal mood and affect. Her speech is normal and behavior is  normal. Her mood appears not anxious.    ED Course  Procedures (including critical care time)  Medications  0.9 %  sodium chloride infusion ( Intravenous Rate/Dose Verify 09/28/13 1400)  sodium chloride 0.9 % bolus 500 mL (0 mLs Intravenous Stopped 09/28/13 0754)  morphine 2 MG/ML injection 2 mg (2 mg Intravenous Given 09/28/13 0752)  sodium chloride 0.9 % bolus 500 mL (0 mLs Intravenous Stopped 09/28/13 1001)  iohexol (OMNIPAQUE) 300 MG/ML solution 50 mL (50 mLs Oral Contrast Given 09/28/13 0926)  morphine 2 MG/ML injection 2 mg (2 mg Intravenous Given 09/28/13 1001)  iohexol (OMNIPAQUE) 300 MG/ML solution 60 mL (60 mLs Intravenous Contrast Given 09/28/13 1023)  morphine 2 MG/ML injection 2 mg (2 mg Intravenous Given 09/28/13 1216)    09:05 Reviewed patient's labs and xrays. Discussed possible diverticulitis, agreeable to getting a AP CT scan.  11:58 Radiology called AP CT results, feels c/w ischemia of the terminal illeum.   12:19 Dr Isidoro Donning, unable to see result of AP CT in Epic.  I called [facility] Radiology Room and the state they will see what is wrong.   12:53 Dr Isidoro Donning called back, still no result of CT in EPIC, she states she will  come see patient.  Pt noted to now getting borderline BP at 100 systolic and starting to get a tachycardia.  Another bolus of fluid ordered.   I called [facility] Radiology, they state result is now in [facility] and they will contact IT for Epic  13:00 I gave Dr Isidoro Donning a printed copy of the report from [facility].   AP CT scan Impression shows abnormal appearance of the loops of the distal ileum consistent with enteritis. Although no specific features to suggest ischemia are identified ischemia cannot be excluded due to her underlying vascular abnormalities. The CT appearance is most in keeping with an infectious or inflammatory enteritis.  Labs Review  Results for orders placed during the hospital encounter of 09/28/13  CBC WITH DIFFERENTIAL      Result Value Range   WBC 3.5  (*) 4.0 - 10.5 K/uL   RBC 4.81  3.87 - 5.11 MIL/uL   Hemoglobin 14.7  12.0 - 15.0 g/dL   HCT 16.1  09.6 - 04.5 %   MCV 88.6  78.0 - 100.0 fL   MCH 30.6  26.0 - 34.0 pg   MCHC 34.5  30.0 - 36.0 g/dL   RDW 40.9  81.1 - 91.4 %   Platelets 365  150 - 400 K/uL   Neutrophils Relative % 87 (*) 43 - 77 %   Neutro Abs 3.1  1.7 - 7.7 K/uL   Lymphocytes Relative 12  12 - 46 %   Lymphs Abs 0.4 (*) 0.7 - 4.0 K/uL   Monocytes Relative 2 (*) 3 - 12 %   Monocytes Absolute 0.1  0.1 - 1.0 K/uL   Eosinophils Relative 0  0 - 5 %   Eosinophils Absolute 0.0  0.0 - 0.7 K/uL   Basophils Relative 0  0 - 1 %   Basophils Absolute 0.0  0.0 - 0.1 K/uL  COMPREHENSIVE METABOLIC PANEL      Result Value Range   Sodium 127 (*) 135 - 145 mEq/L   Potassium 4.0  3.5 - 5.1 mEq/L   Chloride 97  96 - 112 mEq/L   CO2 20  19 - 32 mEq/L   Glucose, Bld 124 (*) 70 - 99 mg/dL   BUN 27 (*) 6 - 23 mg/dL   Creatinine, Ser 7.82  0.50 - 1.10 mg/dL   Calcium 8.1 (*) 8.4 - 10.5 mg/dL   Total Protein 6.8  6.0 - 8.3 g/dL   Albumin 2.5 (*) 3.5 - 5.2 g/dL   AST 21  0 - 37 U/L   ALT 30  0 - 35 U/L   Alkaline Phosphatase 80  39 - 117 U/L   Total Bilirubin 0.4  0.3 - 1.2 mg/dL   GFR calc non Af Amer 87 (*) >90 mL/min   GFR calc Af Amer >90  >90 mL/min  LACTIC ACID, PLASMA      Result Value Range   Lactic Acid, Venous 1.1  0.5 - 2.2 mmol/L   Laboratory interpretation all normal except hyponatremia, elevated BUN c/w dehydration    Results for orders placed in visit on 09/26/13  COMPREHENSIVE METABOLIC PANEL (CC13)      Result Value Range   Sodium 132 (*) 136 - 145 mEq/L   Potassium 5.4 (*) 3.5 - 5.1 mEq/L   Chloride 101  98 - 109 mEq/L   CO2 27  22 - 29 mEq/L   Glucose 118  70 - 140 mg/dl   BUN 95.6 (*) 7.0 - 21.3 mg/dL   Creatinine  0.8  0.6 - 1.1 mg/dL   Total Bilirubin 4.09  0.20 - 1.20 mg/dL   Alkaline Phosphatase 73  40 - 150 U/L   AST 24  5 - 34 U/L   ALT 32  0 - 55 U/L   Total Protein 7.8  6.4 - 8.3 g/dL    Albumin 2.5 (*) 3.5 - 5.0 g/dL   Calcium 8.8  8.4 - 81.1 mg/dL   Anion Gap 5  3 - 11 mEq/L  CBC WITH DIFFERENTIAL      Result Value Range   WBC 15.5 (*) 3.9 - 10.3 10e3/uL   NEUT# 14.1 (*) 1.5 - 6.5 10e3/uL   HGB 13.2  11.6 - 15.9 g/dL   HCT 91.4  78.2 - 95.6 %   Platelets 505 (*) 145 - 400 10e3/uL   MCV 90.8  79.5 - 101.0 fL   MCH 30.3  25.1 - 34.0 pg   MCHC 33.4  31.5 - 36.0 g/dL   RBC 2.13  0.86 - 5.78 10e6/uL   RDW 13.2  11.2 - 14.5 %   lymph# 0.7 (*) 0.9 - 3.3 10e3/uL   MONO# 0.6  0.1 - 0.9 10e3/uL   Eosinophils Absolute 0.0  0.0 - 0.5 10e3/uL   Basophils Absolute 0.1  0.0 - 0.1 10e3/uL   NEUT% 91.1 (*) 38.4 - 76.8 %   LYMPH% 4.3 (*) 14.0 - 49.7 %   MONO% 4.1  0.0 - 14.0 %   EOS% 0.1  0.0 - 7.0 %   BASO% 0.4  0.0 - 2.0 %      Imaging Review  Dg Chest 2 View  09/28/2013   CLINICAL DATA:  Cough.  EXAM: CHEST  2 VIEW  COMPARISON:  September 05, 2013.  FINDINGS: Hyperexpansion of the lungs is noted consistent with chronic obstructive pulmonary disease. No pleural effusion or pneumothorax is noted. Right internal jugular Port-A-Cath is noted with tip in expected position of the SVC. Bilateral breast implants are noted. No acute pulmonary disease is noted. Cardiomediastinal silhouette appears normal.  IMPRESSION: Chronic obstructive pulmonary disease.  No acute abnormality seen.   Electronically Signed   By: Roque Lias M.D.   On: 09/28/2013 08:34   Ct Abdomen Pelvis W Contrast  09/28/2013   CLINICAL DATA:  Constipation abdominal pain.  EXAM: CT ABDOMEN AND PELVIS WITH CONTRAST  TECHNIQUE: Multidetector CT imaging of the abdomen and pelvis was performed using the standard protocol following bolus administration of intravenous contrast.  CONTRAST:  50mL OMNIPAQUE IOHEXOL 300 MG/ML SOLN, 60mL OMNIPAQUE IOHEXOL 300 MG/ML SOLN  COMPARISON:  PET CT scan 09/11/2013 and CT abdomen 07/18/2013.  FINDINGS: The lung bases are clear. Densely calcified breast implants are noted. There is no  pleural or pericardial effusion.  There is marked thickening of the walls of the distal ileum best seen in loops in the pelvis. Extensive infiltration of mesenteric fat within the pelvis is identified There is small amount of scattered abdominal ascites. No focal fluid collection is present. No pneumatosis, portal venous gas or free intraperitoneal air is identified. More proximal loops of small bowel show air-fluid levels and mild dilation at 3.4 cm. The stomach is unremarkable. The colon is largely decompressed with extensive diverticular disease identified but no evidence of diverticulitis.  As seen on prior examinations, there is a juxta-renal abdominal aortic aneurysm measuring 4.1 x 4.2 cm, unchanged. No evidence of hemorrhage is identified. The gallbladder, liver, and spleen are unremarkable. Enhancing foci within the liver and pancreas seen on  the 07/18/2013 study are not visualized on this examination as no arterial phase imaging was performed. The left kidney has been removed. Compensatory hypertrophy of the right kidney is identified. Scarring in the right kidney consistent with prior tumor ablation is noted. Right renal cysts are present. Uterus and adnexa are unremarkable. The urinary bladder is distended but otherwise unremarkable. Bones demonstrate severe convex left scoliosis within osteoarthritis about the right hip. Old left hip fracture with fixation hardware in place noted. Remote left pubic rami fractures also seen.  IMPRESSION: Abnormal appearance of loops of distal ileum as described above is consistent with enteritis. Although no specific features to suggest ischemia are identified, ischemia cannot be excluded. The CT appearance is most in keeping with an infectious or inflammatory enteritis.  No change in a 4.1 cm juxtarenal abdominal aortic aneurysm.  Solitary right kidney with postoperative change of ablation of the renal tumor.  Critical Value/emergent results were called by telephone at  the time of interpretation on 09/28/2013 at 11:59 AM to Dr.Ambri Miltner , who verbally acknowledged these results.   Electronically Signed   By: Drusilla Kanner M.D.   On: 09/28/2013 12:37   Dg Abd 2 Views  09/28/2013   CLINICAL DATA:  Lower abdominal pain. Diarrhea.  EXAM: ABDOMEN - 2 VIEW  COMPARISON:  CT abdomen pelvis 07/20/2013.  FINDINGS: Few prominent loops of small bowel are demonstrated within the left lower quadrant. Otherwise relative paucity of small bowel gas. No evidence for pneumatosis or free intraperitoneal air. Postsurgical clips within the left upper quadrant. Extensive aortic vascular calcifications with aneurysmal dilation of the mid abdominal aorta. Moderate lower thoracic and lumbar spine degenerative change. Surgical hardware within the left femur. Bilateral hip joint degenerative change.  IMPRESSION: Few nonspecific mildly prominent loops of small bowel within the left lower quadrant without definite evidence for ileus or obstruction.   Electronically Signed   By: Annia Belt M.D.   On: 09/28/2013 08:38     Dg Chest 2 View  09/05/2013   IMPRESSION: COPD without acute finding.   Original Report Authenticated By: Leanna Battles, M.D.   Nm Pet Image Initial (pi) Skull Base To Thigh  09/11/2013    ABDOMEN/PELVIS  No abnormal hypermetabolic activity within the liver, pancreas, adrenal glands, or spleen. No hypermetabolic lymph nodes in the abdomen or pelvis. 4 cm abdominal aortic aneurysm again noted, without significant change compared to prior CT on 07/18/2013.  SKELETON  No focal hypermetabolic activity to suggest skeletal metastasis.  IMPRESSION: Hypermetabolic left supraglottic laryngeal mass, consistent with laryngeal carcinoma.  Tiny less than 1 cm hypermetabolic lymph node in the right neck high upper internal jugular chain ; metastatic disease cannot be excluded.  1.5 cm spiculated hypermetabolic nodule in the posterior right upper lobe, with more morphology favoring a primary  bronchogenic carcinoma over pulmonary metastasis. A 2nd 8 mm spiculated nodule in the right lung apex shows absence of hypermetabolic activity, but is below PET size threshold for reliable detection of malignancy.  No evidence of malignancy within the abdomen or pelvis.   Electronically Signed   By: Myles Rosenthal   On: 09/11/2013 12:00   Ir Fluoro Guide Cv Line Right  09/13/2013   IMPRESSION: Placement of a subcutaneous port device. The catheter tip is in the lower SVC and ready to be used.   Electronically Signed   By: Richarda Overlie M.D.   On: 09/13/2013 15:33      MDM   1. Abdominal pain   2.  Enteritis   3. Diarrhea   4. Nausea and vomiting   5. AAA (abdominal aortic aneurysm)   6. Abdominal pain, acute, left lower quadrant   7. HTN (hypertension)   8. Hyponatremia   9. Laryngeal cancer    Plan admission  Devoria Albe, MD, Franz Dell, MD 09/28/13 [phone]

## 2013-09-28 NOTE — H&P (Signed)
History and Physical       Hospital Admission Note Date: 09/28/2013  Patient name: [name] Medical record number: [id number] Date of birth: [birth year] Age: 79 y.o. Gender: female PCP: Ezequiel Kayser, MD    Chief Complaint:  Severe abdominal pain with diarrhea this morning  HPI: Patient is 79 year old female who was recently diagnosed with laryngeal cancer, last month, known history of AAA, nicotine abuse, history of renal cancer s/p nephrectomy presented to [facility] with severe abdominal pain with diarrhea this morning. Patient was recently diagnosed with laryngeal cancer, started chemotherapy last month with Dr. Bertis Ruddy (Oncology), seen by oncology on 09/26/2013. At that time patient had mentioned constipation. Per patient she was given 2 medications for constipation and was thought to have constipation from Zofran scheduled at home, which patient was taking incorrectly. Patient reports that for the last 3-4 days she was having mild lower abdominal pain, worse in the left lower quadrant. However at 3 AM this morning, patient started having severe abdominal pain with multiple episodes of diarrhea. Patient denied any nausea vomiting or any hematochezia or melena. She reports that around 6 AM the diarrhea improved and her pain did not improve, 8/10, sharp worsened left lower quadrant, she was to the family to bring her to the ER.  CT abdomen pelvis in the ER showed enteritis in the loops of distal ileum rule out ischemia, no change in 4.1 cm AAA   Review of Systems:   Constitutional: Denies fever, chills, diaphoresis, + poor appetite and fatigue.  HEENT: Denies photophobia, eye pain, redness, ear pain, congestion, sore throat, rhinorrhea, sneezing, mouth sores, trouble swallowing, neck pain, neck stiffness and tinnitus. patient has significant hearing deficit   Respiratory: Denies SOB, DOE, cough, chest tightness,  and wheezing.    Cardiovascular: Denies chest pain, palpitations and leg swelling.  Gastrointestinal: Please see history of present illness  Genitourinary: Denies dysuria, urgency, frequency, hematuria, flank pain and difficulty urinating.  Musculoskeletal: Denies myalgias, back pain, joint swelling, arthralgias and gait problem.  Skin: Denies pallor, rash and wound.  Neurological: Denies dizziness, seizures, syncope, weakness, light-headedness, numbness and headaches.  Hematological: Denies adenopathy. Easy bruising, personal or family bleeding history  Psychiatric/Behavioral: Denies suicidal ideation, mood changes, confusion, nervousness, sleep disturbance and agitation  Past Medical History: Past Medical History  Diagnosis Date  . Aortic atherosclerosis   . PAD (peripheral artery disease)   . COPD (chronic obstructive pulmonary disease)   . HTN (hypertension)   . Hypercholesteremia   . Glaucoma   . Bronchitis   . Fractured coccyx   . AAA (abdominal aortic aneurysm)   . PAD (peripheral artery disease)   . Renal cell carcinoma 1995    Kidney removal  . Tobacco abuse 10/27/2012  . Laryngeal cancer 09/12/2013   Past Surgical History  Procedure Laterality Date  . Nephrectomy      LEFT/CANCER  . Renal cryoablation      Right with radiation  . Hip fracture surgery  2009  . Tonsillectomy    . Breast surgery      breast implants  . Colonoscopy    . Direct laryngoscopy N/A 09/07/2013    Procedure: DIRECT LARYNGOSCOPY AND DIRECT LARYNGEAL BIOPSIES;  Surgeon: Osborn Coho, MD;  Location: [facility] OR;  Service: ENT;  Laterality: N/A;    Medications: Prior to Admission medications   Medication Sig Start Date End Date Taking? Authorizing Provider  amLODipine (NORVASC) 5 MG tablet Take 1 tablet (5 mg total) by mouth daily. 05/03/13  Yes Peter M [place], MD  aspirin 81 MG tablet Take 81 mg by mouth at bedtime.    Yes Historical Provider, MD  beta carotene w/minerals (OCUVITE) tablet Take 1 tablet by mouth  at bedtime.    Yes Historical Provider, MD  bimatoprost (LUMIGAN) 0.03 % ophthalmic drops Place 1 drop into both eyes at bedtime.    Yes Historical Provider, MD  brimonidine (ALPHAGAN) 0.15 % ophthalmic solution Place 1 drop into both eyes at bedtime.    Yes Historical Provider, MD  brinzolamide (AZOPT) 1 % ophthalmic suspension Place 1 drop into both eyes at bedtime.    Yes Historical Provider, MD  ciprofloxacin-dexamethasone (CIPRODEX) otic suspension Place 2 drops into the right ear 2 (two) times daily.    Yes Historical Provider, MD  Fluticasone-Salmeterol (ADVAIR HFA IN) Inhale into the lungs as needed.     Yes Historical Provider, MD  metoprolol tartrate (LOPRESSOR) 25 MG tablet Take 25 mg by mouth daily.    Yes Historical Provider, MD  ondansetron (ZOFRAN) 8 MG tablet Take 1 tablet (8 mg total) by mouth every 8 (eight) hours as needed for nausea. 09/20/13  Yes Artis Delay, MD  prochlorperazine (COMPAZINE) 10 MG tablet Take 1 tablet (10 mg total) by mouth every 6 (six) hours as needed (Nausea). 09/20/13  Yes Artis Delay, MD  rosuvastatin (CRESTOR) 20 MG tablet Take 20 mg by mouth at bedtime. 05/03/13  Yes Peter M [place], MD  Zoledronic Acid (RECLAST IV) Inject into the vein See admin instructions. Once a year.  Had in Sept 2014.   Yes Historical Provider, MD  amoxicillin (AMOXIL) 875 MG tablet Take 875 mg by mouth daily.    Historical Provider, MD  dexamethasone (DECADRON) 4 MG tablet Take 2 tablets (8 mg total) by mouth 2 (two) times daily with a meal. Start day after chemo for total 3 days each chemo cycle. 09/20/13   Artis Delay, MD  Hydrocodone-Acetaminophen (LORTAB) 10-300 MG/15ML SOLN Take 5-10 mLs by mouth every 4 (four) hours as needed (Pain). 09/07/13   Osborn Coho, MD  lidocaine-prilocaine (EMLA) cream Apply topically as needed. 09/14/13   Artis Delay, MD    Allergies:   Allergies  Allergen Reactions  . Tape     Plastic tape tears skin    Social History:  reports that she has been  smoking Cigarettes.  She has a 32.5 pack-year smoking history. She has never used smokeless tobacco. She reports that she drinks alcohol. She reports that she does not use illicit drugs.  Family History: Family History  Problem Relation Age of Onset  . Heart attack Father   . Hypertension Father   . Heart disease Father   . Hypertension Mother   . Cancer Mother     MOUTH,BREAST,SPINE  . Diabetes Daughter   . Hypertension Daughter     Physical Exam: Blood pressure 100/63, pulse 115, temperature 97.5 F (36.4 C), temperature source Oral, resp. rate 18, SpO2 94.00%. General: Alert, awake, oriented x3, in no acute distress, cachectic-appearing. HEENT: normocephalic, atraumatic, anicteric sclera, pink conjunctiva, perla, oropharynx clear Neck: supple, no masses or lymphadenopathy, no goiter, no bruits  Heart: Regular rate and rhythm, without murmurs, rubs or gallops. Lungs: Clear to auscultation bilaterally, no wheezing, rales or rhonchi. Abdomen: Soft, mildly distended, mild tenderness in the lower abdomen worse than left lower quadrant, normal bowel sounds Extremities: No clubbing, cyanosis or edema with positive pedal pulses. Neuro: Grossly intact, no focal neurological deficits, strength 5/5 upper and lower extremities bilaterally  Psych: alert and oriented x 3, normal mood and affect Skin: no rashes or lesions, warm and dry   LABS on Admission:  Basic Metabolic Panel:  Recent Labs Lab 09/26/13 1239 09/28/13 0746  NA 132* 127*  K 5.4* 4.0  CL  --  97  CO2 27 20  GLUCOSE 118 124*  BUN 31.4* 27*  CREATININE 0.8 0.51  CALCIUM 8.8 8.1*   Liver Function Tests:  Recent Labs Lab 09/26/13 1239 09/28/13 0746  AST 24 21  ALT 32 30  ALKPHOS 73 80  BILITOT 0.27 0.4  PROT 7.8 6.8  ALBUMIN 2.5* 2.5*   No results found for this basename: LIPASE, AMYLASE,  in the last 168 hours No results found for this basename: AMMONIA,  in the last 168 hours CBC:  Recent Labs Lab  09/26/13 1239 09/28/13 0746  WBC 15.5* 3.5*  NEUTROABS 14.1* 3.1  HGB 13.2 14.7  HCT 39.6 42.6  MCV 90.8 88.6  PLT 505* 365   Cardiac Enzymes: No results found for this basename: CKTOTAL, CKMB, CKMBINDEX, TROPONINI,  in the last 168 hours BNP: No components found with this basename: POCBNP,  CBG: No results found for this basename: GLUCAP,  in the last 168 hours   Radiological Exams on Admission: Dg Chest 2 View  09/28/2013   CLINICAL DATA:  Cough.  EXAM: CHEST  2 VIEW  COMPARISON:  September 05, 2013.  FINDINGS: Hyperexpansion of the lungs is noted consistent with chronic obstructive pulmonary disease. No pleural effusion or pneumothorax is noted. Right internal jugular Port-A-Cath is noted with tip in expected position of the SVC. Bilateral breast implants are noted. No acute pulmonary disease is noted. Cardiomediastinal silhouette appears normal.  IMPRESSION: Chronic obstructive pulmonary disease.  No acute abnormality seen.   Electronically Signed   By: Roque Lias M.D.   On: 09/28/2013 08:34   Dg Chest 2 View  09/05/2013   *RADIOLOGY REPORT*  Clinical Data: Preop.  CHEST - 2 VIEW  Comparison: 05/06/2010.  Findings: Trachea is midline.  Heart size stable.  Calcified breast prostheses somewhat obscure the lower hemithoraces.  Lungs are hyperinflated but otherwise grossly clear.  No pleural fluid.  IMPRESSION: COPD without acute finding.   Original Report Authenticated By: Leanna Battles, M.D.   Nm Pet Image Initial (pi) Skull Base To Thigh  09/11/2013   CLINICAL DATA:  Initial treatment strategy for laryngeal carcinoma. Previous treatment for renal cell carcinoma.  EXAM: NUCLEAR MEDICINE PET SKULL BASE TO THIGH  FASTING BLOOD GLUCOSE:  Value: 124 mg/dl  TECHNIQUE: 16.1 mCi W-96 FDG was injected intravenously. CT data was obtained and used for attenuation correction and anatomic localization only. (This was not acquired as a diagnostic CT examination.) Additional exam technical data  entered on technologist worksheet.  COMPARISON:  None.  FINDINGS: NECK  A left supraglottic mass is seen with diffuse hypermetabolic activity gallbladder and maximum SUV measuring 17.1.  No left-sided hypermetabolic lymph nodes are seen. A small less than 1 cm hypermetabolic lymph node is seen on the right, in the high upper internal jugular chain (level IIa) on image 27-28, which has a maximum SUV of 4.6.  CHEST  No hypermetabolic mediastinal or hilar nodes. A 1.5 cm spiculated nodule is seen in the posterior right upper lobe which has hypermetabolic activity. The maximum SUV measures 4.5, and in the spiculated morphology by CT suggests a primary bronchogenic carcinoma. Smaller spiculated nodule measuring approximately 8 mm in the right lung apex shows no hypermetabolic activity, but  is below size threshold for reliable detection of malignancy.  ABDOMEN/PELVIS  No abnormal hypermetabolic activity within the liver, pancreas, adrenal glands, or spleen. No hypermetabolic lymph nodes in the abdomen or pelvis. 4 cm abdominal aortic aneurysm again noted, without significant change compared to prior CT on 07/18/2013.  SKELETON  No focal hypermetabolic activity to suggest skeletal metastasis.  IMPRESSION: Hypermetabolic left supraglottic laryngeal mass, consistent with laryngeal carcinoma.  Tiny less than 1 cm hypermetabolic lymph node in the right neck high upper internal jugular chain ; metastatic disease cannot be excluded.  1.5 cm spiculated hypermetabolic nodule in the posterior right upper lobe, with more morphology favoring a primary bronchogenic carcinoma over pulmonary metastasis. A 2nd 8 mm spiculated nodule in the right lung apex shows absence of hypermetabolic activity, but is below PET size threshold for reliable detection of malignancy.  No evidence of malignancy within the abdomen or pelvis.   Electronically Signed   By: Myles Rosenthal   On: 09/11/2013 12:00   Ir Fluoro Guide Cv Line Right  09/13/2013    CLINICAL DATA:  79 year old with laryngeal cancer.  EXAM: FLUOROSCOPIC AND ULTRASOUND GUIDED PLACEMENT OF A SUBCUTANEOUS PORT.  Physician: Rachelle Hora. Lowella Dandy, MD  MEDICATIONS AND MEDICAL HISTORY: Versed 2 mg, fentanyl 100 mcg. Ancef 2 gm. A radiology nurse monitored the patient for moderate sedation. As antibiotic prophylaxis, Ancef was ordered pre-procedure and administered intravenously within one hour of incision.  ANESTHESIA/SEDATION: Moderate sedation time: 40 minutes  FLUOROSCOPY TIME:  42 seconds  PROCEDURE: The risks of the procedure were explained to the patient. Informed consent was obtained. Patient was placed supine on the interventional table. Ultrasound confirmed a patent right internal jugular vein. The right chest and neck were cleaned with a skin antiseptic and a sterile drape was placed. Maximal barrier sterile technique was utilized including caps, mask, sterile gowns, sterile gloves, sterile drape, hand hygiene and skin antiseptic. The right neck was anesthetized with 1% lidocaine. Small incision was made in the right neck with a blade. Micropuncture set was placed in the right IJ with ultrasound guidance. The micropuncture wire was used for measurement purposes. The right chest was anesthetized with 1% lidocaine with epinephrine. #15 blade was used to make an incision and a subcutaneous port pocket was formed. 8 french Power Port was assembled. Subcutaneous tunnel was formed with a stiff tunneling device. The port catheter was brought through the subcutaneous tunnel. The port was placed in the subcutaneous pocket. The micropuncture set was exchanged for a peel-away sheath. The catheter was placed through the peel-away sheath and the tip was positioned in the lower SVC. Catheter placement was confirmed with fluoroscopy. The port was accessed and flushed with heparinized saline. The port pocket was closed using two layers of absorbable sutures and Dermabond. The vein skin site was closed using a single  layer of absorbable suture and Dermabond. Sterile dressings were applied. Patient tolerated the procedure well without an immediate complication. Ultrasound and fluoroscopic images were taken and saved for this procedure.  COMPLICATIONS: None  IMPRESSION: Placement of a subcutaneous port device. The catheter tip is in the lower SVC and ready to be used.   Electronically Signed   By: Richarda Overlie M.D.   On: 09/13/2013 15:33   Ir US Guide Vasc Access Right  09/13/2013   CLINICAL DATA:  79 year old with laryngeal cancer.  EXAM: FLUOROSCOPIC AND ULTRASOUND GUIDED PLACEMENT OF A SUBCUTANEOUS PORT.  Physician: Rachelle Hora. Lowella Dandy, MD  MEDICATIONS AND MEDICAL HISTORY: Versed 2 mg,  fentanyl 100 mcg. Ancef 2 gm. A radiology nurse monitored the patient for moderate sedation. As antibiotic prophylaxis, Ancef was ordered pre-procedure and administered intravenously within one hour of incision.  ANESTHESIA/SEDATION: Moderate sedation time: 40 minutes  FLUOROSCOPY TIME:  42 seconds  PROCEDURE: The risks of the procedure were explained to the patient. Informed consent was obtained. Patient was placed supine on the interventional table. Ultrasound confirmed a patent right internal jugular vein. The right chest and neck were cleaned with a skin antiseptic and a sterile drape was placed. Maximal barrier sterile technique was utilized including caps, mask, sterile gowns, sterile gloves, sterile drape, hand hygiene and skin antiseptic. The right neck was anesthetized with 1% lidocaine. Small incision was made in the right neck with a blade. Micropuncture set was placed in the right IJ with ultrasound guidance. The micropuncture wire was used for measurement purposes. The right chest was anesthetized with 1% lidocaine with epinephrine. #15 blade was used to make an incision and a subcutaneous port pocket was formed. 8 french Power Port was assembled. Subcutaneous tunnel was formed with a stiff tunneling device. The port catheter was brought  through the subcutaneous tunnel. The port was placed in the subcutaneous pocket. The micropuncture set was exchanged for a peel-away sheath. The catheter was placed through the peel-away sheath and the tip was positioned in the lower SVC. Catheter placement was confirmed with fluoroscopy. The port was accessed and flushed with heparinized saline. The port pocket was closed using two layers of absorbable sutures and Dermabond. The vein skin site was closed using a single layer of absorbable suture and Dermabond. Sterile dressings were applied. Patient tolerated the procedure well without an immediate complication. Ultrasound and fluoroscopic images were taken and saved for this procedure.  COMPLICATIONS: None  IMPRESSION: Placement of a subcutaneous port device. The catheter tip is in the lower SVC and ready to be used.   Electronically Signed   By: Richarda Overlie M.D.   On: 09/13/2013 15:33   Dg Abd 2 Views  09/28/2013   CLINICAL DATA:  Lower abdominal pain. Diarrhea.  EXAM: ABDOMEN - 2 VIEW  COMPARISON:  CT abdomen pelvis 07/20/2013.  FINDINGS: Few prominent loops of small bowel are demonstrated within the left lower quadrant. Otherwise relative paucity of small bowel gas. No evidence for pneumatosis or free intraperitoneal air. Postsurgical clips within the left upper quadrant. Extensive aortic vascular calcifications with aneurysmal dilation of the mid abdominal aorta. Moderate lower thoracic and lumbar spine degenerative change. Surgical hardware within the left femur. Bilateral hip joint degenerative change.  IMPRESSION: Few nonspecific mildly prominent loops of small bowel within the left lower quadrant without definite evidence for ileus or obstruction.   Electronically Signed   By: Annia Belt M.D.   On: 09/28/2013 08:38    Assessment/Plan Principal Problem:   Enteritis with abdominal pain, diarrhea - Patient is tachycardiac, hr 115-120'S, BP 100/63, frail-appearing, admit to step down for 24 hours  until stable - Obtain blood cultures, UA, stool studies, also rule out any C. difficile - For now, placed on ciprofloxacin and Flagyl - Clear liquid diet  Active Problems:   COPD (chronic obstructive pulmonary disease) - Currently stable, placed on Xopenex nebs, O2    Tobacco abuse - Patient counseled on smoking cessation    Laryngeal cancer: Currently undergoing chemotherapy - Notify oncology    Hyponatremia - Placed on IV fluid hydration   DVT prophylaxis:  Heparin subcutaneous  CODE STATUS:  Discussed in detail with the  patient and she wants to be full CODE STATUS  Further plan will depend as patient's clinical course evolves and further radiologic and laboratory data become available.   Time Spent on Admission: 1 HOUR  Larayne Baxley M.D. [facility] 09/28/2013, 1:18 PM Pager: [phone]  If 7PM-7AM, please contact night-coverage [email or web address] Password [email or web address]

## 2013-09-28 NOTE — ED Notes (Signed)
Pt placed on bedpan but unable to void at this time.

## 2013-09-28 NOTE — ED Notes (Signed)
Patient's spouse states that she had been taking pain medication incorrectly and became constipated as a result causing the initial abdominal pain. Patient was given dulcolax by her husband and she has continued to have abdominal pain. She had some loose stool but nothing solid.

## 2013-09-28 NOTE — ED Notes (Signed)
Pt unable to void asked me to come back in a few mins

## 2013-09-28 NOTE — ED Notes (Signed)
Pt taken to restroom with steady unable to provide UA did have small BM

## 2013-09-29 DIAGNOSIS — F17200 Nicotine dependence, unspecified, uncomplicated: Secondary | ICD-10-CM

## 2013-09-29 DIAGNOSIS — K5289 Other specified noninfective gastroenteritis and colitis: Secondary | ICD-10-CM

## 2013-09-29 LAB — URINE CULTURE

## 2013-09-29 LAB — BASIC METABOLIC PANEL
BUN: 17 mg/dL (ref 6–23)
CO2: 19 mEq/L (ref 19–32)
Calcium: 6.8 mg/dL — ABNORMAL LOW (ref 8.4–10.5)
Chloride: 98 mEq/L (ref 96–112)
Creatinine, Ser: 0.5 mg/dL (ref 0.50–1.10)
GFR calc Af Amer: >90 mL/min (ref >90)

## 2013-09-29 LAB — CBC
HCT: 34.4 % — ABNORMAL LOW (ref 36.0–46.0)
Hemoglobin: 11.7 g/dL — ABNORMAL LOW (ref 12.0–15.0)
MCHC: 34 g/dL (ref 30.0–36.0)
MCV: 88.4 fL (ref 78.0–100.0)
Platelets: 246 10*3/uL (ref 150–400)
RDW: 13.5 % (ref 11.5–15.5)

## 2013-09-29 LAB — FECAL LACTOFERRIN, QUANT: Fecal Lactoferrin: POSITIVE

## 2013-09-29 MED ORDER — BRINZOLAMIDE 1 % OP SUSP
1.0000 [drp] | Freq: Every day | OPHTHALMIC | Status: DC
  Administered 2013-09-29 – 2013-10-02 (×4): 1 [drp] via OPHTHALMIC
  Filled 2013-09-29: qty 10

## 2013-09-29 MED ORDER — BRIMONIDINE TARTRATE 0.15 % OP SOLN
1.0000 [drp] | Freq: Every day | OPHTHALMIC | Status: DC
  Administered 2013-09-29 – 2013-10-02 (×4): 1 [drp] via OPHTHALMIC
  Filled 2013-09-29: qty 5

## 2013-09-29 MED ORDER — ALUM & MAG HYDROXIDE-SIMETH 200-200-20 MG/5ML PO SUSP
15.0000 mL | Freq: Two times a day (BID) | ORAL | Status: DC | PRN
  Administered 2013-09-29: 19:00:00 15 mL via ORAL
  Filled 2013-09-29: qty 30

## 2013-09-29 NOTE — Progress Notes (Signed)
[facility] PROGRESS NOTE  [name] ZOX:[id number] DOB: [birth year] DOA: 09/28/2013 PCP: Ezequiel Kayser, MD  Assessment/Plan: Enteritis with abdominal pain, diarrhea  - Patient initially presented tachycardiac, hr 115-120'S, BP 100/63, frail-appearing and was admitted to step down - Placed on ciprofloxacin and Flagyl  - Clear liquid diet as tolerated - Await pan cultures COPD (chronic obstructive pulmonary disease)  - Currently stable, placed on Xopenex nebs, O2  Tobacco abuse  - Patient counseled on smoking cessation  Laryngeal cancer: Currently undergoing chemotherapy  - Notify oncology  Hyponatremia  - Placed on IV fluid hydration   Code Status: Full Family Communication: Pt in room (indicate person spoken with, relationship, and if by phone, the number) Disposition Plan: Pending  Antibiotics:  Ciprofloxacin 09/28/13>>>  Flagyl 09/28/13>>>  HPI/Subjective: No acute events noted overnight  Objective: Filed Vitals:   09/28/13 2200 09/29/13 0000 09/29/13 0400 09/29/13 0700  BP: 99/55  129/63   Pulse:      Temp:  98.1 F (36.7 C) 98.4 F (36.9 C)   TempSrc:  Oral Oral   Resp: 26  22 30   Height:      Weight:   35.7 kg (78 lb 11.3 oz)   SpO2: 95%  94% 94%    Intake/Output Summary (Last 24 hours) at 09/29/13 0803 Last data filed at 09/29/13 0700  Gross per 24 hour  Intake   2145 ml  Output    530 ml  Net   1615 ml   Filed Weights   09/28/13 1400 09/29/13 0400  Weight: 37.7 kg (83 lb 1.8 oz) 35.7 kg (78 lb 11.3 oz)    Exam:   General:  Awake, in nad  Cardiovascular: regular, s1, s2  Respiratory: normal resp effort, no wheezing  Abdomen: soft, normal BS  Musculoskeletal: perfused, no clubbing   Data Reviewed: Basic Metabolic Panel:  Recent Labs Lab 09/26/13 1239 09/28/13 0746 09/29/13 0355  NA 132* 127* 125*  K 5.4* 4.0 3.9  CL  --  97 98  CO2 27 20 19   GLUCOSE 118 124* 144*  BUN 31.4* 27* 17  CREATININE 0.8 0.51 0.50   CALCIUM 8.8 8.1* 6.8*   Liver Function Tests:  Recent Labs Lab 09/26/13 1239 09/28/13 0746  AST 24 21  ALT 32 30  ALKPHOS 73 80  BILITOT 0.27 0.4  PROT 7.8 6.8  ALBUMIN 2.5* 2.5*   No results found for this basename: LIPASE, AMYLASE,  in the last 168 hours No results found for this basename: AMMONIA,  in the last 168 hours CBC:  Recent Labs Lab 09/26/13 1239 09/28/13 0746 09/29/13 0355  WBC 15.5* 3.5* 6.0  NEUTROABS 14.1* 3.1  --   HGB 13.2 14.7 11.7*  HCT 39.6 42.6 34.4*  MCV 90.8 88.6 88.4  PLT 505* 365 246   Cardiac Enzymes: No results found for this basename: CKTOTAL, CKMB, CKMBINDEX, TROPONINI,  in the last 168 hours BNP (last 3 results) No results found for this basename: PROBNP,  in the last 8760 hours CBG: No results found for this basename: GLUCAP,  in the last 168 hours  Recent Results (from the past 240 hour(s))  MRSA PCR SCREENING     Status: None   Collection Time    09/28/13  3:42 PM      Result Value Range Status   MRSA by PCR NEGATIVE  NEGATIVE Final   Comment:            The GeneXpert MRSA Assay (FDA  approved for NASAL specimens     only), is one component of a     comprehensive MRSA colonization     surveillance program. It is not     intended to diagnose MRSA     infection nor to guide or     monitor treatment for     MRSA infections.     Studies: Dg Chest 2 View  09/28/2013   CLINICAL DATA:  Cough.  EXAM: CHEST  2 VIEW  COMPARISON:  September 05, 2013.  FINDINGS: Hyperexpansion of the lungs is noted consistent with chronic obstructive pulmonary disease. No pleural effusion or pneumothorax is noted. Right internal jugular Port-A-Cath is noted with tip in expected position of the SVC. Bilateral breast implants are noted. No acute pulmonary disease is noted. Cardiomediastinal silhouette appears normal.  IMPRESSION: Chronic obstructive pulmonary disease.  No acute abnormality seen.   Electronically Signed   By: Roque Lias M.D.   On:  09/28/2013 08:34   Ct Abdomen Pelvis W Contrast  09/28/2013   CLINICAL DATA:  Constipation abdominal pain.  EXAM: CT ABDOMEN AND PELVIS WITH CONTRAST  TECHNIQUE: Multidetector CT imaging of the abdomen and pelvis was performed using the standard protocol following bolus administration of intravenous contrast.  CONTRAST:  50mL OMNIPAQUE IOHEXOL 300 MG/ML SOLN, 60mL OMNIPAQUE IOHEXOL 300 MG/ML SOLN  COMPARISON:  PET CT scan 09/11/2013 and CT abdomen 07/18/2013.  FINDINGS: The lung bases are clear. Densely calcified breast implants are noted. There is no pleural or pericardial effusion.  There is marked thickening of the walls of the distal ileum best seen in loops in the pelvis. Extensive infiltration of mesenteric fat within the pelvis is identified There is small amount of scattered abdominal ascites. No focal fluid collection is present. No pneumatosis, portal venous gas or free intraperitoneal air is identified. More proximal loops of small bowel show air-fluid levels and mild dilation at 3.4 cm. The stomach is unremarkable. The colon is largely decompressed with extensive diverticular disease identified but no evidence of diverticulitis.  As seen on prior examinations, there is a juxta-renal abdominal aortic aneurysm measuring 4.1 x 4.2 cm, unchanged. No evidence of hemorrhage is identified. The gallbladder, liver, and spleen are unremarkable. Enhancing foci within the liver and pancreas seen on the 07/18/2013 study are not visualized on this examination as no arterial phase imaging was performed. The left kidney has been removed. Compensatory hypertrophy of the right kidney is identified. Scarring in the right kidney consistent with prior tumor ablation is noted. Right renal cysts are present. Uterus and adnexa are unremarkable. The urinary bladder is distended but otherwise unremarkable. Bones demonstrate severe convex left scoliosis within osteoarthritis about the right hip. Old left hip fracture with  fixation hardware in place noted. Remote left pubic rami fractures also seen.  IMPRESSION: Abnormal appearance of loops of distal ileum as described above is consistent with enteritis. Although no specific features to suggest ischemia are identified, ischemia cannot be excluded. The CT appearance is most in keeping with an infectious or inflammatory enteritis.  No change in a 4.1 cm juxtarenal abdominal aortic aneurysm.  Solitary right kidney with postoperative change of ablation of the renal tumor.  Critical Value/emergent results were called by telephone at the time of interpretation on 09/28/2013 at 11:59 AM to Dr.IVA KNAPP , who verbally acknowledged these results.   Electronically Signed   By: Drusilla Kanner M.D.   On: 09/28/2013 12:37   Dg Abd 2 Views  09/28/2013   CLINICAL DATA:  Lower abdominal pain. Diarrhea.  EXAM: ABDOMEN - 2 VIEW  COMPARISON:  CT abdomen pelvis 07/20/2013.  FINDINGS: Few prominent loops of small bowel are demonstrated within the left lower quadrant. Otherwise relative paucity of small bowel gas. No evidence for pneumatosis or free intraperitoneal air. Postsurgical clips within the left upper quadrant. Extensive aortic vascular calcifications with aneurysmal dilation of the mid abdominal aorta. Moderate lower thoracic and lumbar spine degenerative change. Surgical hardware within the left femur. Bilateral hip joint degenerative change.  IMPRESSION: Few nonspecific mildly prominent loops of small bowel within the left lower quadrant without definite evidence for ileus or obstruction.   Electronically Signed   By: Annia Belt M.D.   On: 09/28/2013 08:38    Scheduled Meds: . beta carotene w/minerals  1 tablet Oral QHS  . ciprofloxacin  400 mg Intravenous Q12H  . ciprofloxacin-dexamethasone  2 drop Right Ear BID  . feeding supplement (RESOURCE BREEZE)  1 Container Oral TID BM  . heparin  5,000 Units Subcutaneous Q8H  . latanoprost  1 drop Both Eyes QHS  . metoprolol tartrate   25 mg Oral Daily  . metronidazole  500 mg Intravenous Q8H  . protein supplement  1 scoop Oral TID WC  . sodium chloride  3 mL Intravenous Q12H   Continuous Infusions: . sodium chloride 75 mL/hr at 09/28/13 1600    Principal Problem:   Enteritis Active Problems:   COPD (chronic obstructive pulmonary disease)   Tobacco abuse   Laryngeal cancer   Hyponatremia   Abdominal pain, acute, left lower quadrant  Time spent:  Jhalen Eley K  [facility] Pager [phone]. If 7PM-7AM, please contact night-coverage at [email or web address], password [facility] 09/29/2013, 8:03 AM  LOS: 1 day

## 2013-09-29 NOTE — Evaluation (Signed)
Physical Therapy Evaluation Patient Details Name: [name] MRN: [id number] DOB: 11-14-1928 Today's Date: 09/29/2013 Time: 8469-6295 PT Time Calculation (min): 18 min  PT Assessment / Plan / Recommendation History of Present Illness  Patient is 79 year old female who was recently diagnosed with laryngeal cancer, last month, known history of AAA, nicotine abuse, history of renal cancer s/p nephrectomy presented to [facility] 10/17  year with severe abdominal pain with diarrhea this morning. Patient was recently diagnosed with laryngeal cancer, started chemotherapy last month .    Clinical Impression  *Pt repeated stated that she could not walk. Pt did ambulate a short distance. Pt will benefit from PT to address Problems. Pt's spouse very supportive and helpful.    PT Assessment  Patient needs continued PT services    Follow Up Recommendations  Home health PT    Does the patient have the potential to tolerate intense rehabilitation      Barriers to Discharge        Equipment Recommendations  None recommended by PT    Recommendations for Other Services     Frequency Min 3X/week    Precautions / Restrictions Precautions Precautions: Fall Precaution Comments: contact   Pertinent Vitals/Pain No c/o      Mobility  Bed Mobility Bed Mobility: Supine to Sit Supine to Sit: 4: Min guard Transfers Transfers: Sit to Stand;Stand to Sit Sit to Stand: From bed;With upper extremity assist Stand to Sit: To chair/3-in-1;With upper extremity assist Details for Transfer Assistance: pt repeatedly stated that she could not stand.Pt encouraged to try and did well.multimodal cues for safety. Ambulation/Gait Ambulation/Gait Assistance: 1: +2 Total assist (for equipment.) Ambulation/Gait: Patient Percentage: 60% Ambulation Distance (Feet): 10 Feet Assistive device: Rolling walker Ambulation/Gait Assistance Details: multimodal cues for safety and use of RW. Gait Pattern: Step-to  pattern;Trunk flexed;Decreased stride length    Exercises     PT Diagnosis: Difficulty walking;Generalized weakness  PT Problem List: Decreased strength;Decreased activity tolerance;Decreased mobility;Decreased balance;Decreased knowledge of use of DME;Decreased safety awareness;Decreased knowledge of precautions;Cardiopulmonary status limiting activity PT Treatment Interventions: DME instruction;Gait training;Functional mobility training;Therapeutic activities;Therapeutic exercise;Patient/family education     PT Goals(Current goals can be found in the care plan section) Acute Rehab PT Goals Patient Stated Goal: I want to go home PT Goal Formulation: With patient/family Time For Goal Achievement: 10/13/13 Potential to Achieve Goals: Good  Visit Information  Last PT Received On: 09/29/13 Assistance Needed: +1 History of Present Illness: Patient is 79 year old female who was recently diagnosed with laryngeal cancer, last month, known history of AAA, nicotine abuse, history of renal cancer s/p nephrectomy presented to [facility] 10/17  year with severe abdominal pain with diarrhea this morning. Patient was recently diagnosed with laryngeal cancer, started chemotherapy last month with Dr. Bertis Ruddy (Oncology), seen by oncology on 09/26/2013. .         Prior Functioning  Home Living Family/patient expects to be discharged to:: Private residence Living Arrangements: Spouse/significant other Available Help at Discharge: Family Type of Home: House Home Access: Stairs to enter Secretary/administrator of Steps: 4 Home Layout: One level Home Equipment: Environmental consultant - 2 wheels;Bedside commode Prior Function Level of Independence: Independent Communication Communication: mild difficulty understanding some of pt's speech.   Cognition  Cognition Arousal/Alertness: Awake/alert Behavior During Therapy: WFL for tasks assessed/performed Overall Cognitive Status: Within Functional Limits for tasks  assessed    Extremity/Trunk Assessment Upper Extremity Assessment Upper Extremity Assessment: Generalized weakness Lower Extremity Assessment Lower Extremity Assessment: Generalized weakness Cervical / Trunk Assessment  Cervical / Trunk Assessment: Kyphotic   Balance Balance Balance Assessed: Yes Static Sitting Balance Static Sitting - Balance Support: Feet supported;Bilateral upper extremity supported Static Sitting - Level of Assistance: 5: Stand by assistance  End of Session PT - End of Session Activity Tolerance: Patient limited by fatigue Patient left: in chair;with call bell/phone within reach;with family/visitor present Nurse Communication: Mobility status  GP     Rada Hay 09/29/2013, 3:46 PM

## 2013-09-30 ENCOUNTER — Inpatient Hospital Stay (HOSPITAL_COMMUNITY): Payer: Medicare Other

## 2013-09-30 DIAGNOSIS — R197 Diarrhea, unspecified: Secondary | ICD-10-CM

## 2013-09-30 DIAGNOSIS — E43 Unspecified severe protein-calorie malnutrition: Secondary | ICD-10-CM

## 2013-09-30 DIAGNOSIS — E860 Dehydration: Secondary | ICD-10-CM

## 2013-09-30 DIAGNOSIS — B370 Candidal stomatitis: Secondary | ICD-10-CM

## 2013-09-30 DIAGNOSIS — R918 Other nonspecific abnormal finding of lung field: Secondary | ICD-10-CM

## 2013-09-30 DIAGNOSIS — A0472 Enterocolitis due to Clostridium difficile, not specified as recurrent: Principal | ICD-10-CM

## 2013-09-30 LAB — BASIC METABOLIC PANEL
BUN: 13 mg/dL (ref 6–23)
Calcium: 7 mg/dL — ABNORMAL LOW (ref 8.4–10.5)
Chloride: 100 mEq/L (ref 96–112)
Creatinine, Ser: 0.4 mg/dL — ABNORMAL LOW (ref 0.50–1.10)
GFR calc Af Amer: >90 mL/min (ref >90)
GFR calc non Af Amer: >90 mL/min (ref >90)
Glucose, Bld: 112 mg/dL — ABNORMAL HIGH (ref 70–99)
Potassium: 3.3 mEq/L — ABNORMAL LOW (ref 3.5–5.1)

## 2013-09-30 LAB — CBC WITH DIFFERENTIAL/PLATELET
Basophils Absolute: 0 10*3/uL (ref 0.0–0.1)
Basophils Relative: 0 % (ref 0–1)
Eosinophils Absolute: 0 10*3/uL (ref 0.0–0.7)
Eosinophils Relative: 0 % (ref 0–5)
HCT: 36.1 % (ref 36.0–46.0)
Lymphs Abs: 0.4 10*3/uL — ABNORMAL LOW (ref 0.7–4.0)
MCHC: 34.6 g/dL (ref 30.0–36.0)
MCV: 87.4 fL (ref 78.0–100.0)
Monocytes Absolute: 0.4 10*3/uL (ref 0.1–1.0)
Monocytes Relative: 4 % (ref 3–12)
Neutro Abs: 8 10*3/uL — ABNORMAL HIGH (ref 1.7–7.7)
Platelets: 321 10*3/uL (ref 150–400)
RDW: 13.6 % (ref 11.5–15.5)
WBC: 8.8 10*3/uL (ref 4.0–10.5)

## 2013-09-30 LAB — MAGNESIUM: Magnesium: 1.9 mg/dL (ref 1.5–2.5)

## 2013-09-30 MED ORDER — VANCOMYCIN 50 MG/ML ORAL SOLUTION
125.0000 mg | Freq: Four times a day (QID) | ORAL | Status: DC
  Administered 2013-09-30 – 2013-10-01 (×3): 125 mg via ORAL
  Filled 2013-09-30 (×9): qty 2.5

## 2013-09-30 MED ORDER — DILTIAZEM LOAD VIA INFUSION
10.0000 mg | Freq: Once | INTRAVENOUS | Status: AC
  Administered 2013-09-30: 10 mg via INTRAVENOUS
  Filled 2013-09-30: qty 10

## 2013-09-30 MED ORDER — DILTIAZEM LOAD VIA INFUSION
15.0000 mg | Freq: Once | INTRAVENOUS | Status: AC
  Administered 2013-09-30: 15 mg via INTRAVENOUS
  Filled 2013-09-30: qty 15

## 2013-09-30 MED ORDER — DILTIAZEM HCL 100 MG IV SOLR
5.0000 mg/h | INTRAVENOUS | Status: DC
  Administered 2013-09-30 – 2013-10-01 (×3): 5 mg/h via INTRAVENOUS
  Filled 2013-09-30 (×3): qty 100

## 2013-09-30 MED ORDER — DILTIAZEM HCL 100 MG IV SOLR: 5.0000 mg/h | INTRAVENOUS | Status: DC

## 2013-09-30 MED ORDER — SACCHAROMYCES BOULARDII 250 MG PO CAPS
250.0000 mg | ORAL_CAPSULE | Freq: Two times a day (BID) | ORAL | Status: DC
  Administered 2013-09-30: 250 mg via ORAL
  Filled 2013-09-30 (×3): qty 1

## 2013-09-30 MED ORDER — SODIUM CHLORIDE 0.9 % IV BOLUS (SEPSIS)
500.0000 mL | Freq: Once | INTRAVENOUS | Status: AC
  Administered 2013-09-30: 08:00:00 500 mL via INTRAVENOUS

## 2013-09-30 MED ORDER — NYSTATIN 100000 UNIT/ML MT SUSP
5.0000 mL | Freq: Four times a day (QID) | OROMUCOSAL | Status: DC
  Administered 2013-09-30 – 2013-10-01 (×4): 500000 [IU] via ORAL
  Filled 2013-09-30 (×6): qty 5

## 2013-09-30 MED ORDER — DILTIAZEM HCL 25 MG/5ML IV SOLN: 15.0000 mg | Freq: Once | INTRAVENOUS | Status: DC

## 2013-09-30 MED ORDER — SODIUM CHLORIDE 0.9 % IJ SOLN
10.0000 mL | INTRAMUSCULAR | Status: DC | PRN
  Administered 2013-09-30: 20:00:00 20 mL

## 2013-09-30 MED ORDER — DILTIAZEM LOAD VIA INFUSION: 15.0000 mg | Freq: Once | INTRAVENOUS | Status: DC

## 2013-09-30 MED ORDER — LIDOCAINE-PRILOCAINE 2.5-2.5 % EX CREA
TOPICAL_CREAM | Freq: Once | CUTANEOUS | Status: AC
  Administered 2013-09-30: 20:00:00 via TOPICAL
  Filled 2013-09-30: qty 5

## 2013-09-30 NOTE — Progress Notes (Signed)
[phone] patient heart rate went to 140's, patient was asymptomatic. Got EKG and paged on call Lynch NP. Gave orders of Cardizem 10mg  bolus, and 5mg  continous drip. Will continue to monitor and watch patient. Mercer Pod D

## 2013-09-30 NOTE — Progress Notes (Signed)
Paged MD Rhona Leavens about HR not decreasing. New orders given. We will continue to monitor patient. Mercer Pod D

## 2013-09-30 NOTE — Consult Note (Signed)
[facility] CONSULT NOTE  Patient Care Team: Ezequiel Kayser, MD as PCP - General (Internal Medicine) Reola Calkins, MD (Radiology) Sharene Butters, MD as Attending Physician (Ophthalmology) Dennis Bast, RN as Registered Nurse (Oncology) Osborn Coho, MD as Consulting Physician (Otolaryngology) Artis Delay, MD as Consulting Physician (Hematology and Oncology)  CHIEF COMPLAINTS/PURPOSE OF CONSULTATION:  This is a patient well-known to me. Please see her oncologic history as summarized below Oncology History   Laryngeal cancer   Primary site: Larynx - Supraglottis (Left)   Staging method: AJCC 7th Edition   Clinical: Stage IVC (T2, N1, M1) signed by Artis Delay, MD on 09/12/2013  9:43 PM   Pathologic: Stage IVC (T2, N1, M1) signed by Artis Delay, MD on 09/12/2013  9:43 PM   Summary: Stage IVC (T2, N1, M1)   Clinical comments:     Indeterminate lung mass, presumably related to cancer above. Cannot rule      out possibility of lung primary or metastatic kidney cancer       Laryngeal cancer   08/22/2013 Cancer Staging CT scan of neck showed large laryngeal mass   08/27/2013 Procedure FNA biopsy of larynx non-diagnostic but suspicious for cancer   09/07/2013 Surgery Direct laryngosocpy confirmed mass in the left pharyngeal wall and pyriform sinus. Biopsy confirmed invasive squamous cell carcinoma, p16 positive   09/11/2013 Cancer Staging PET/CT scan confirmed activity in larynx, LN involvement and a mass in the right lung   09/12/2013 Initial Diagnosis Laryngeal cancer    HISTORY OF PRESENTING ILLNESS:  [name] 79 y.o. female is here because of severe uncontrolled diarrhea, and subsequently was found to have Clostridium difficile. 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 The patient has significant smoking history. Recently, after prolonged discussion, we agree on  palliative chemotherapy with combination chemotherapy with carboplatin and Taxol. She has received 2 doses of chemotherapy recently, her third dose would be due in 3 days time. The patient was also treated with antibiotics for recurrent cough and bronchitis. 2 days ago, she started to have profuse, uncontrolled diarrhea and was subsequently admitted to the hospital with extensive workup including stool studies, blood work, and imaging studies and was subsequently diagnosed C.Diff. She is currently on bowel rest, fluid resuscitation, as well as antibiotics. Her cough is improving. She does have a productive cough with clear sputum only. She has very poor appetite. Denies any nausea, or vomiting. She has some mild intermittent abdominal pain that comes and goes.  MEDICAL HISTORY:  Past Medical History  Diagnosis Date  . Aortic atherosclerosis   . PAD (peripheral artery disease)   . COPD (chronic obstructive pulmonary disease)   . HTN (hypertension)   . Hypercholesteremia   . Glaucoma   . Bronchitis   . Fractured coccyx   . AAA (abdominal aortic aneurysm)   . PAD (peripheral artery disease)   . Renal cell carcinoma 1995    Kidney removal  . Tobacco abuse 10/27/2012  . Laryngeal cancer 09/12/2013    SURGICAL HISTORY: Past Surgical History  Procedure Laterality Date  . Nephrectomy      LEFT/CANCER  . Renal cryoablation      Right with radiation  . Hip fracture surgery  2009  . Tonsillectomy    . Breast surgery      breast implants  . Colonoscopy    . Direct laryngoscopy N/A 09/07/2013    Procedure: DIRECT  LARYNGOSCOPY AND DIRECT LARYNGEAL BIOPSIES;  Surgeon: Osborn Coho, MD;  Location: [facility] OR;  Service: ENT;  Laterality: N/A;    SOCIAL HISTORY: History   Social History  . Marital Status: Married    Spouse Name: N/A    Number of Children: 3  . Years of Education: N/A   Occupational History  . homemaker     retired   Social History Main Topics  . Smoking status:  Current Every Day Smoker -- 0.50 packs/day for 65 years    Types: Cigarettes  . Smokeless tobacco: Never Used  . Alcohol Use: Yes     Comment: occasional/socially  . Drug Use: No  . Sexual Activity: No   Other Topics Concern  . Not on file   Social History Narrative  . No narrative on file    FAMILY HISTORY: Family History  Problem Relation Age of Onset  . Heart attack Father   . Hypertension Father   . Heart disease Father   . Hypertension Mother   . Cancer Mother     MOUTH,BREAST,SPINE  . Diabetes Daughter   . Hypertension Daughter     ALLERGIES:  is allergic to tape.  MEDICATIONS:  Current Facility-Administered Medications  Medication Dose Route Frequency Provider Last Rate Last Dose  . 0.9 %  sodium chloride infusion   Intravenous Continuous Jerald Kief, MD 100 mL/hr at 09/30/13 0154    . acetaminophen (TYLENOL) tablet 650 mg  650 mg Oral Q6H PRN Ripudeep Jenna Luo, MD       Or  . acetaminophen (TYLENOL) suppository 650 mg  650 mg Rectal Q6H PRN Ripudeep K Rai, MD      . alum & mag hydroxide-simeth (MAALOX/MYLANTA) 200-200-20 MG/5ML suspension 15 mL  15 mL Oral BID PRN Jerald Kief, MD   15 mL at 09/29/13 1854  . beta carotene w/minerals (OCUVITE) tablet 1 tablet  1 tablet Oral QHS Ripudeep Jenna Luo, MD   1 tablet at 09/29/13 2200  . brimonidine (ALPHAGAN) 0.15 % ophthalmic solution 1 drop  1 drop Both Eyes QHS Jerald Kief, MD   1 drop at 09/29/13 2118  . brinzolamide (AZOPT) 1 % ophthalmic suspension 1 drop  1 drop Both Eyes QHS Jerald Kief, MD   1 drop at 09/29/13 2126  . ciprofloxacin-dexamethasone (CIPRODEX) 0.3-0.1 % otic suspension 2 drop  2 drop Right Ear BID Ripudeep Jenna Luo, MD   2 drop at 09/29/13 1015  . diltiazem (CARDIZEM) 100 mg in dextrose 5 % 100 mL infusion  5-15 mg/hr Intravenous Continuous Jinger Neighbors, NP 5 mL/hr at 09/30/13 0702 5 mg/hr at 09/30/13 0702  . feeding supplement (RESOURCE BREEZE) (RESOURCE BREEZE) liquid 1 Container  1 Container Oral  TID BM Lorraine Lax, RD   1 Container at 09/30/13 0950  . guaiFENesin-dextromethorphan (ROBITUSSIN DM) 100-10 MG/5ML syrup 5 mL  5 mL Oral Q4H PRN Ripudeep K Rai, MD      . heparin injection 5,000 Units  5,000 Units Subcutaneous Q8H Ripudeep Jenna Luo, MD   5,000 Units at 09/30/13 1312  . HYDROcodone-acetaminophen (NORCO/VICODIN) 5-325 MG per tablet 1 tablet  1 tablet Oral Q4H PRN Ripudeep Jenna Luo, MD   1 tablet at 09/30/13 0944  . HYDROmorphone (DILAUDID) injection 1 mg  1 mg Intravenous Q4H PRN Ripudeep Jenna Luo, MD   1 mg at 09/28/13 2247  . latanoprost (XALATAN) 0.005 % ophthalmic solution 1 drop  1 drop Both Eyes QHS Ripudeep Jenna Luo, MD  1 drop at 09/29/13 2114  . levalbuterol (XOPENEX) nebulizer solution 0.63 mg  0.63 mg Nebulization Q6H PRN Ripudeep K Rai, MD      . metoprolol tartrate (LOPRESSOR) tablet 25 mg  25 mg Oral Daily Ripudeep Jenna Luo, MD   25 mg at 09/30/13 0944  . metroNIDAZOLE (FLAGYL) IVPB 500 mg  500 mg Intravenous Q8H Ripudeep K Rai, MD   500 mg at 09/30/13 1311  . protein supplement (RESOURCE BENEPROTEIN) powder packet 6 g  1 scoop Oral TID WC Lorraine Lax, RD   6 g at 09/30/13 0823  . sodium chloride 0.9 % injection 3 mL  3 mL Intravenous Q12H Ripudeep K Rai, MD   3 mL at 09/29/13 1106  . vancomycin (VANCOCIN) 50 mg/mL oral solution 125 mg  125 mg Oral Q6H Jerald Kief, MD   125 mg at 09/30/13 0944    REVIEW OF SYSTEMS:   Skin: Denies abnormal skin rashes Lymphatics: Denies new lymphadenopathy or easy bruising Neurological:Denies numbness, tingling or new weaknesses Behavioral/Psych: Mood is stable, no new changes  All other systems were reviewed with the patient and are negative.  PHYSICAL EXAMINATION: ECOG PERFORMANCE STATUS: 2 - Symptomatic, <50% confined to bed  Filed Vitals:   09/30/13 1445  BP: 115/62  Pulse:   Temp:   Resp:    Filed Weights   09/28/13 1400 09/29/13 0400  Weight: 83 lb 1.8 oz (37.7 kg) 78 lb 11.3 oz (35.7 kg)    GENERAL:alert, no  distress and comfortable SKIN: skin color, texture, turgor are normal, no rashes or significant lesions EYES: normal, conjunctiva are pink and non-injected, sclera clear OROPHARYNX:no exudate, no erythema and lips, buccal mucosa, and tongue normal  NECK: supple, thyroid normal size, non-tender, without nodularity LYMPH:  no palpable lymphadenopathy in the cervical, axillary or inguinal LUNGS: clear to auscultation and percussion with normal breathing effort HEART: regular rate & rhythm and no murmurs and no lower extremity edema ABDOMEN:abdomen soft, non-tender and normal bowel sounds Musculoskeletal:no cyanosis of digits and no clubbing  PSYCH: alert & oriented x 3 with fluent speech NEURO: no focal motor/sensory deficits  LABORATORY DATA:  I have reviewed the data as listed Recent Results (from the past 2160 hour(s))  BASIC METABOLIC PANEL     Status: Abnormal   Collection Time    09/05/13  3:45 PM      Result Value Range   Sodium 139  135 - 145 mEq/L   Potassium 4.5  3.5 - 5.1 mEq/L   Chloride 104  96 - 112 mEq/L   CO2 27  19 - 32 mEq/L   Glucose, Bld 90  70 - 99 mg/dL   BUN 11  6 - 23 mg/dL   Creatinine, Ser 1.61  0.50 - 1.10 mg/dL   Calcium 8.7  8.4 - 09.6 mg/dL   GFR calc non Af Amer 80 (*) >90 mL/min   GFR calc Af Amer >90  >90 mL/min   Comment: (NOTE)     The eGFR has been calculated using the CKD EPI equation.     This calculation has not been validated in all clinical situations.     eGFR's persistently <90 mL/min signify possible Chronic Kidney     Disease.  CBC     Status: None   Collection Time    09/05/13  3:45 PM      Result Value Range   WBC 10.5  4.0 - 10.5 K/uL   RBC 4.60  3.87 -  5.11 MIL/uL   Hemoglobin 14.1  12.0 - 15.0 g/dL   HCT 16.1  09.6 - 04.5 %   MCV 92.0  78.0 - 100.0 fL   MCH 30.7  26.0 - 34.0 pg   MCHC 33.3  30.0 - 36.0 g/dL   RDW 40.9  81.1 - 91.4 %   Platelets 362  150 - 400 K/uL  GLUCOSE, CAPILLARY     Status: Abnormal   Collection Time     09/11/13  8:54 AM      Result Value Range   Glucose-Capillary 124 (*) 70 - 99 mg/dL  APTT     Status: None   Collection Time    09/13/13 12:15 PM      Result Value Range   aPTT 27  24 - 37 seconds  CBC WITH DIFFERENTIAL     Status: Abnormal   Collection Time    09/13/13 12:15 PM      Result Value Range   WBC 13.9 (*) 4.0 - 10.5 K/uL   RBC 4.35  3.87 - 5.11 MIL/uL   Hemoglobin 13.3  12.0 - 15.0 g/dL   HCT 78.2  95.6 - 21.3 %   MCV 90.3  78.0 - 100.0 fL   MCH 30.6  26.0 - 34.0 pg   MCHC 33.8  30.0 - 36.0 g/dL   RDW 08.6  57.8 - 46.9 %   Platelets 372  150 - 400 K/uL   Neutrophils Relative % 80 (*) 43 - 77 %   Neutro Abs 11.0 (*) 1.7 - 7.7 K/uL   Lymphocytes Relative 11 (*) 12 - 46 %   Lymphs Abs 1.5  0.7 - 4.0 K/uL   Monocytes Relative 9  3 - 12 %   Monocytes Absolute 1.3 (*) 0.1 - 1.0 K/uL   Eosinophils Relative 0  0 - 5 %   Eosinophils Absolute 0.0  0.0 - 0.7 K/uL   Basophils Relative 0  0 - 1 %   Basophils Absolute 0.0  0.0 - 0.1 K/uL  PROTIME-INR     Status: None   Collection Time    09/13/13 12:15 PM      Result Value Range   Prothrombin Time 14.5  11.6 - 15.2 seconds   INR 1.15  0.00 - 1.49  COMPREHENSIVE METABOLIC PANEL (CC13)     Status: Abnormal   Collection Time    09/26/13 12:39 PM      Result Value Range   Sodium 132 (*) 136 - 145 mEq/L   Potassium 5.4 (*) 3.5 - 5.1 mEq/L   Chloride 101  98 - 109 mEq/L   CO2 27  22 - 29 mEq/L   Glucose 118  70 - 140 mg/dl   BUN 62.9 (*) 7.0 - 52.8 mg/dL   Creatinine 0.8  0.6 - 1.1 mg/dL   Total Bilirubin 4.13  0.20 - 1.20 mg/dL   Alkaline Phosphatase 73  40 - 150 U/L   AST 24  5 - 34 U/L   ALT 32  0 - 55 U/L   Total Protein 7.8  6.4 - 8.3 g/dL   Albumin 2.5 (*) 3.5 - 5.0 g/dL   Calcium 8.8  8.4 - 24.4 mg/dL   Anion Gap 5  3 - 11 mEq/L  CBC WITH DIFFERENTIAL     Status: Abnormal   Collection Time    09/26/13 12:39 PM      Result Value Range   WBC 15.5 (*) 3.9 - 10.3 10e3/uL   NEUT#  14.1 (*) 1.5 - 6.5 10e3/uL    HGB 13.2  11.6 - 15.9 g/dL   HCT 16.1  09.6 - 04.5 %   Platelets 505 (*) 145 - 400 10e3/uL   MCV 90.8  79.5 - 101.0 fL   MCH 30.3  25.1 - 34.0 pg   MCHC 33.4  31.5 - 36.0 g/dL   RBC 4.09  8.11 - 9.14 10e6/uL   RDW 13.2  11.2 - 14.5 %   lymph# 0.7 (*) 0.9 - 3.3 10e3/uL   MONO# 0.6  0.1 - 0.9 10e3/uL   Eosinophils Absolute 0.0  0.0 - 0.5 10e3/uL   Basophils Absolute 0.1  0.0 - 0.1 10e3/uL   NEUT% 91.1 (*) 38.4 - 76.8 %   LYMPH% 4.3 (*) 14.0 - 49.7 %   MONO% 4.1  0.0 - 14.0 %   EOS% 0.1  0.0 - 7.0 %   BASO% 0.4  0.0 - 2.0 %  CBC WITH DIFFERENTIAL     Status: Abnormal   Collection Time    09/28/13  7:46 AM      Result Value Range   WBC 3.5 (*) 4.0 - 10.5 K/uL   RBC 4.81  3.87 - 5.11 MIL/uL   Hemoglobin 14.7  12.0 - 15.0 g/dL   HCT 78.2  95.6 - 21.3 %   MCV 88.6  78.0 - 100.0 fL   MCH 30.6  26.0 - 34.0 pg   MCHC 34.5  30.0 - 36.0 g/dL   RDW 08.6  57.8 - 46.9 %   Platelets 365  150 - 400 K/uL   Neutrophils Relative % 87 (*) 43 - 77 %   Neutro Abs 3.1  1.7 - 7.7 K/uL   Lymphocytes Relative 12  12 - 46 %   Lymphs Abs 0.4 (*) 0.7 - 4.0 K/uL   Monocytes Relative 2 (*) 3 - 12 %   Monocytes Absolute 0.1  0.1 - 1.0 K/uL   Eosinophils Relative 0  0 - 5 %   Eosinophils Absolute 0.0  0.0 - 0.7 K/uL   Basophils Relative 0  0 - 1 %   Basophils Absolute 0.0  0.0 - 0.1 K/uL  COMPREHENSIVE METABOLIC PANEL     Status: Abnormal   Collection Time    09/28/13  7:46 AM      Result Value Range   Sodium 127 (*) 135 - 145 mEq/L   Potassium 4.0  3.5 - 5.1 mEq/L   Chloride 97  96 - 112 mEq/L   CO2 20  19 - 32 mEq/L   Glucose, Bld 124 (*) 70 - 99 mg/dL   BUN 27 (*) 6 - 23 mg/dL   Creatinine, Ser 6.29  0.50 - 1.10 mg/dL   Calcium 8.1 (*) 8.4 - 10.5 mg/dL   Total Protein 6.8  6.0 - 8.3 g/dL   Albumin 2.5 (*) 3.5 - 5.2 g/dL   AST 21  0 - 37 U/L   ALT 30  0 - 35 U/L   Alkaline Phosphatase 80  39 - 117 U/L   Total Bilirubin 0.4  0.3 - 1.2 mg/dL   GFR calc non Af Amer 87 (*) >90 mL/min   GFR  calc Af Amer >90  >90 mL/min   Comment: (NOTE)     The eGFR has been calculated using the CKD EPI equation.     This calculation has not been validated in all clinical situations.     eGFR's persistently <90 mL/min signify possible Chronic Kidney  Disease.  LACTIC ACID, PLASMA     Status: None   Collection Time    09/28/13 12:21 PM      Result Value Range   Lactic Acid, Venous 1.1  0.5 - 2.2 mmol/L  MRSA PCR SCREENING     Status: None   Collection Time    09/28/13  3:42 PM      Result Value Range   MRSA by PCR NEGATIVE  NEGATIVE   Comment:            The GeneXpert MRSA Assay (FDA     approved for NASAL specimens     only), is one component of a     comprehensive MRSA colonization     surveillance program. It is not     intended to diagnose MRSA     infection nor to guide or     monitor treatment for     MRSA infections.  URINALYSIS, ROUTINE W REFLEX MICROSCOPIC     Status: Abnormal   Collection Time    09/28/13  3:51 PM      Result Value Range   Color, Urine YELLOW  YELLOW   APPearance CLOUDY (*) CLEAR   Specific Gravity, Urine >1.046 (*) 1.005 - 1.030   pH 5.0  5.0 - 8.0   Glucose, UA NEGATIVE  NEGATIVE mg/dL   Hgb urine dipstick NEGATIVE  NEGATIVE   Bilirubin Urine NEGATIVE  NEGATIVE   Ketones, ur NEGATIVE  NEGATIVE mg/dL   Protein, ur 30 (*) NEGATIVE mg/dL   Urobilinogen, UA 0.2  0.0 - 1.0 mg/dL   Nitrite NEGATIVE  NEGATIVE   Leukocytes, UA NEGATIVE  NEGATIVE  URINE CULTURE     Status: None   Collection Time    09/28/13  3:51 PM      Result Value Range   Specimen Description URINE, RANDOM     Special Requests NONE     Culture  Setup Time       Value: 09/28/2013 21:53     Performed at [facility] Count       Value: 75,000 COLONIES/ML     Performed at [facility]   Culture       Value: Multiple bacterial morphotypes present, none predominant. Suggest appropriate recollection if clinically indicated.     Performed at [facility]   Report Status 09/29/2013 FINAL    URINE MICROSCOPIC-ADD ON     Status: Abnormal   Collection Time    09/28/13  3:51 PM      Result Value Range   Squamous Epithelial / LPF MANY (*) RARE   WBC, UA 3-6  <3 WBC/hpf   Bacteria, UA RARE  RARE  BASIC METABOLIC PANEL     Status: Abnormal   Collection Time    09/29/13  3:55 AM      Result Value Range   Sodium 125 (*) 135 - 145 mEq/L   Potassium 3.9  3.5 - 5.1 mEq/L   Chloride 98  96 - 112 mEq/L   CO2 19  19 - 32 mEq/L   Glucose, Bld 144 (*) 70 - 99 mg/dL   BUN 17  6 - 23 mg/dL   Creatinine, Ser 1.61  0.50 - 1.10 mg/dL   Calcium 6.8 (*) 8.4 - 10.5 mg/dL   GFR calc non Af Amer 88 (*) >90 mL/min   GFR calc Af Amer >90  >90 mL/min   Comment: (NOTE)     The eGFR  has been calculated using the CKD EPI equation.     This calculation has not been validated in all clinical situations.     eGFR's persistently <90 mL/min signify possible Chronic Kidney     Disease.  CBC     Status: Abnormal   Collection Time    09/29/13  3:55 AM      Result Value Range   WBC 6.0  4.0 - 10.5 K/uL   RBC 3.89  3.87 - 5.11 MIL/uL   Hemoglobin 11.7 (*) 12.0 - 15.0 g/dL   Comment: DELTA CHECK NOTED     REPEATED TO VERIFY   HCT 34.4 (*) 36.0 - 46.0 %   MCV 88.4  78.0 - 100.0 fL   MCH 30.1  26.0 - 34.0 pg   MCHC 34.0  30.0 - 36.0 g/dL   RDW 16.1  09.6 - 04.5 %   Platelets 246  150 - 400 K/uL   Comment: DELTA CHECK NOTED     REPEATED TO VERIFY     SPECIMEN CHECKED FOR CLOTS  CLOSTRIDIUM DIFFICILE BY PCR     Status: Abnormal   Collection Time    09/29/13  7:03 AM      Result Value Range   C difficile by pcr POSITIVE (*) NEGATIVE   Comment: CRITICAL RESULT CALLED TO, READ BACK BY AND VERIFIED WITH:     BISHOP J.,RN 09/29/13 1148 BY JONESJ     Performed at [facility]  FECAL LACTOFERRIN     Status: None   Collection Time    09/29/13  7:03 AM      Result Value Range   Specimen Description STOOL     Special Requests NONE     Fecal  Lactoferrin       Value: POSITIVE     Performed at [facility]   Report Status 09/29/2013 FINAL    CBC WITH DIFFERENTIAL     Status: Abnormal   Collection Time    09/30/13  8:23 AM      Result Value Range   WBC 8.8  4.0 - 10.5 K/uL   RBC 4.13  3.87 - 5.11 MIL/uL   Hemoglobin 12.5  12.0 - 15.0 g/dL   HCT 40.9  81.1 - 91.4 %   MCV 87.4  78.0 - 100.0 fL   MCH 30.3  26.0 - 34.0 pg   MCHC 34.6  30.0 - 36.0 g/dL   RDW 78.2  95.6 - 21.3 %   Platelets 321  150 - 400 K/uL   Comment: DELTA CHECK NOTED   Neutrophils Relative % 91 (*) 43 - 77 %   Lymphocytes Relative 5 (*) 12 - 46 %   Monocytes Relative 4  3 - 12 %   Eosinophils Relative 0  0 - 5 %   Basophils Relative 0  0 - 1 %   Neutro Abs 8.0 (*) 1.7 - 7.7 K/uL   Lymphs Abs 0.4 (*) 0.7 - 4.0 K/uL   Monocytes Absolute 0.4  0.1 - 1.0 K/uL   Eosinophils Absolute 0.0  0.0 - 0.7 K/uL   Basophils Absolute 0.0  0.0 - 0.1 K/uL   RBC Morphology CRENATED RBCs     WBC Morphology INCREASED BANDS (>20% BANDS)     Comment: VACUOLATED NEUTROPHILS  BASIC METABOLIC PANEL     Status: Abnormal   Collection Time    09/30/13  8:23 AM      Result Value Range   Sodium 128 (*) 135 -  145 mEq/L   Potassium 3.3 (*) 3.5 - 5.1 mEq/L   Comment: SLIGHT HEMOLYSIS   Chloride 100  96 - 112 mEq/L   CO2 17 (*) 19 - 32 mEq/L   Glucose, Bld 112 (*) 70 - 99 mg/dL   BUN 13  6 - 23 mg/dL   Creatinine, Ser 1.61 (*) 0.50 - 1.10 mg/dL   Calcium 7.0 (*) 8.4 - 10.5 mg/dL   GFR calc non Af Amer >90  >90 mL/min   GFR calc Af Amer >90  >90 mL/min   Comment: (NOTE)     The eGFR has been calculated using the CKD EPI equation.     This calculation has not been validated in all clinical situations.     eGFR's persistently <90 mL/min signify possible Chronic Kidney     Disease.  MAGNESIUM     Status: None   Collection Time    09/30/13  8:23 AM      Result Value Range   Magnesium 1.9  1.5 - 2.5 mg/dL    RADIOGRAPHIC STUDIES: I have personally reviewed the  radiological images as listed and agreed with the findings in the report. Dg Chest 2 View  09/28/2013   CLINICAL DATA:  Cough.  EXAM: CHEST  2 VIEW  COMPARISON:  September 05, 2013.  FINDINGS: Hyperexpansion of the lungs is noted consistent with chronic obstructive pulmonary disease. No pleural effusion or pneumothorax is noted. Right internal jugular Port-A-Cath is noted with tip in expected position of the SVC. Bilateral breast implants are noted. No acute pulmonary disease is noted. Cardiomediastinal silhouette appears normal.  IMPRESSION: Chronic obstructive pulmonary disease.  No acute abnormality seen.   Electronically Signed   By: Roque Lias M.D.   On: 09/28/2013 08:34   Dg Chest 2 View  09/05/2013   *RADIOLOGY REPORT*  Clinical Data: Preop.  CHEST - 2 VIEW  Comparison: 05/06/2010.  Findings: Trachea is midline.  Heart size stable.  Calcified breast prostheses somewhat obscure the lower hemithoraces.  Lungs are hyperinflated but otherwise grossly clear.  No pleural fluid.  IMPRESSION: COPD without acute finding.   Original Report Authenticated By: Leanna Battles, M.D.   Ct Abdomen Pelvis W Contrast  09/28/2013   CLINICAL DATA:  Constipation abdominal pain.  EXAM: CT ABDOMEN AND PELVIS WITH CONTRAST  TECHNIQUE: Multidetector CT imaging of the abdomen and pelvis was performed using the standard protocol following bolus administration of intravenous contrast.  CONTRAST:  50mL OMNIPAQUE IOHEXOL 300 MG/ML SOLN, 60mL OMNIPAQUE IOHEXOL 300 MG/ML SOLN  COMPARISON:  PET CT scan 09/11/2013 and CT abdomen 07/18/2013.  FINDINGS: The lung bases are clear. Densely calcified breast implants are noted. There is no pleural or pericardial effusion.  There is marked thickening of the walls of the distal ileum best seen in loops in the pelvis. Extensive infiltration of mesenteric fat within the pelvis is identified There is small amount of scattered abdominal ascites. No focal fluid collection is present. No  pneumatosis, portal venous gas or free intraperitoneal air is identified. More proximal loops of small bowel show air-fluid levels and mild dilation at 3.4 cm. The stomach is unremarkable. The colon is largely decompressed with extensive diverticular disease identified but no evidence of diverticulitis.  As seen on prior examinations, there is a juxta-renal abdominal aortic aneurysm measuring 4.1 x 4.2 cm, unchanged. No evidence of hemorrhage is identified. The gallbladder, liver, and spleen are unremarkable. Enhancing foci within the liver and pancreas seen on the 07/18/2013 study are not visualized  on this examination as no arterial phase imaging was performed. The left kidney has been removed. Compensatory hypertrophy of the right kidney is identified. Scarring in the right kidney consistent with prior tumor ablation is noted. Right renal cysts are present. Uterus and adnexa are unremarkable. The urinary bladder is distended but otherwise unremarkable. Bones demonstrate severe convex left scoliosis within osteoarthritis about the right hip. Old left hip fracture with fixation hardware in place noted. Remote left pubic rami fractures also seen.  IMPRESSION: Abnormal appearance of loops of distal ileum as described above is consistent with enteritis. Although no specific features to suggest ischemia are identified, ischemia cannot be excluded. The CT appearance is most in keeping with an infectious or inflammatory enteritis.  No change in a 4.1 cm juxtarenal abdominal aortic aneurysm.  Solitary right kidney with postoperative change of ablation of the renal tumor.  Critical Value/emergent results were called by telephone at the time of interpretation on 09/28/2013 at 11:59 AM to Dr.IVA KNAPP , who verbally acknowledged these results.   Electronically Signed   By: Drusilla Kanner M.D.   On: 09/28/2013 12:37   09/28/2013   CLINICAL DATA:  Lower abdominal pain. Diarrhea.  EXAM: ABDOMEN - 2 VIEW  COMPARISON:  CT  abdomen pelvis 07/20/2013.  FINDINGS: Few prominent loops of small bowel are demonstrated within the left lower quadrant. Otherwise relative paucity of small bowel gas. No evidence for pneumatosis or free intraperitoneal air. Postsurgical clips within the left upper quadrant. Extensive aortic vascular calcifications with aneurysmal dilation of the mid abdominal aorta. Moderate lower thoracic and lumbar spine degenerative change. Surgical hardware within the left femur. Bilateral hip joint degenerative change.  IMPRESSION: Few nonspecific mildly prominent loops of small bowel within the left lower quadrant without definite evidence for ileus or obstruction.   Electronically Signed   By: Annia Belt M.D.   On: 09/28/2013 08:38   Dg Abd Portable 1v  09/30/2013   CLINICAL DATA:  Abdominal pain  EXAM: PORTABLE ABDOMEN - 1 VIEW  COMPARISON:  09/28/2013  FINDINGS: Disproportionally dilated small bowel loops extend across the abdomen. Mild distention of: Marland Kitchen No obvious free intraperitoneal gas. Postoperative, posttraumatic, and degenerative changes of the bony structures are stable.  IMPRESSION: Stable partial small bowel obstruction pattern.   Electronically Signed   By: Maryclare Bean M.D.   On: 09/30/2013 09:14    ASSESSMENT:  79 year old lady with laryngeal cancer, lung mass, history of kidney cancer, recent chemotherapy now diagnosed with C. difficile infection  PLAN:  #1 laryngeal cancer The patient is willing to stop palliative chemotherapy. I don't think it is unreasonable. We will continue supportive care #2 C. difficile infection This is likely due to recent exposure off antibiotics. She is currently on appropriate treatment. We'll continue the same. #3 intermittent abdominal pain This is related to the C. difficile infection. There were no signs of bowel perforation on recent CT scan. She will need to use intermittent pain medicine as needed #4 severe malnutrition The patient has lost a lot of weight  prior to the diagnosis of laryngeal cancer. Now she is diagnosed with C. difficile infection. I recommend primary service to consider total parenteral nutrition if she continued to lose weight and her infection is not getting better. #5 severe hyponatremic and hypokalemia This is likely related to hypovolemia and fluid loss from her diarrhea. She is currently on IV fluid replacement. #6 dehydration She is receiving fluid resuscitation. I recommend we start using her Infuse-a-Port to spare her  from venous access via her peripheral veins Her prognosis is poor because of her age and comorbidities. The patient is talking about giving up hope and wanted to go home and die. I recommend she stay until her infection resolves. Clinically, she appears to have responded to chemotherapy with less hoarseness of voice. I continue to provide her with emotional support.  All questions were answered. The patient knows to call the clinic with any problems, questions or concerns.   Katrell Milhorn, MD @T @ 3:00 PM

## 2013-09-30 NOTE — Progress Notes (Signed)
OT Cancellation Note  Patient Details Name: AMERIA SANJURJO MRN: [id number] DOB: [birth year]   Cancelled Treatment:    Reason Eval/Treat Not Completed: Other (comment)  Spoke to RN; they just got pt's HR under control.  We will check back tomorrow.    Lashawn Bromwell 09/30/2013, 12:10 PM Marica Otter, OTR/L [phone] 09/30/2013

## 2013-09-30 NOTE — Progress Notes (Signed)
[facility] PROGRESS NOTE  [name] XBJ:[id number] DOB: [birth year] DOA: 09/28/2013 PCP: Ezequiel Kayser, MD  Assessment/Plan: C.diff Enteritis with abdominal pain, diarrhea  - Patient initially presented tachycardiac, hr 115-120'S, BP 100/63, frail-appearing and was admitted to step down - Initially placed on ciprofloxacin and Flagyl  - Cipro has since been discontinued - Clear liquid diet as tolerated COPD (chronic obstructive pulmonary disease)  - Currently stable, placed on Xopenex nebs, O2  Tobacco abuse  - Patient counseled on smoking cessation  Laryngeal cancer: Currently undergoing chemotherapy  - Followed by oncology  Hyponatremia  - Placed on IV fluid hydration  Afib with RVR - Now on cardizem gtt, s/p 2 cardizem boluses - Maximize IVF as tolerated - Follow lytes and correct as needed  Code Status: DNR Family Communication: Pt in room Disposition Plan: Pending  Antibiotics:  Ciprofloxacin 09/28/13>>>09/29/13  Flagyl 09/28/13>>>  Vancomycin PO 09/30/13  HPI/Subjective: Noted to be in afib w/ RVR this AM. Complains of worse abd.pain this AM  Objective: Filed Vitals:   09/29/13 1400 09/29/13 1530 09/29/13 2145 09/30/13 0535  BP:  105/69 147/75 150/72  Pulse:   102 70  Temp:  97.5 F (36.4 C) 98 F (36.7 C) 97.9 F (36.6 C)  TempSrc:   Oral Oral  Resp: 25  20 20   Height:      Weight:      SpO2: 96% 97% 95% 99%    Intake/Output Summary (Last 24 hours) at 09/30/13 0819 Last data filed at 09/30/13 0700  Gross per 24 hour  Intake   2400 ml  Output    430 ml  Net   1970 ml   Filed Weights   09/28/13 1400 09/29/13 0400  Weight: 37.7 kg (83 lb 1.8 oz) 35.7 kg (78 lb 11.3 oz)    Exam:   General:  Awake, in nad  Cardiovascular: regular, s1, s2  Respiratory: normal resp effort, no wheezing  Abdomen: soft, normal BS  Musculoskeletal: perfused, no clubbing   Data Reviewed: Basic Metabolic Panel:  Recent Labs Lab 09/26/13 1239  09/28/13 0746 09/29/13 0355  NA 132* 127* 125*  K 5.4* 4.0 3.9  CL  --  97 98  CO2 27 20 19   GLUCOSE 118 124* 144*  BUN 31.4* 27* 17  CREATININE 0.8 0.51 0.50  CALCIUM 8.8 8.1* 6.8*   Liver Function Tests:  Recent Labs Lab 09/26/13 1239 09/28/13 0746  AST 24 21  ALT 32 30  ALKPHOS 73 80  BILITOT 0.27 0.4  PROT 7.8 6.8  ALBUMIN 2.5* 2.5*   No results found for this basename: LIPASE, AMYLASE,  in the last 168 hours No results found for this basename: AMMONIA,  in the last 168 hours CBC:  Recent Labs Lab 09/26/13 1239 09/28/13 0746 09/29/13 0355  WBC 15.5* 3.5* 6.0  NEUTROABS 14.1* 3.1  --   HGB 13.2 14.7 11.7*  HCT 39.6 42.6 34.4*  MCV 90.8 88.6 88.4  PLT 505* 365 246   Cardiac Enzymes: No results found for this basename: CKTOTAL, CKMB, CKMBINDEX, TROPONINI,  in the last 168 hours BNP (last 3 results) No results found for this basename: PROBNP,  in the last 8760 hours CBG: No results found for this basename: GLUCAP,  in the last 168 hours  Recent Results (from the past 240 hour(s))  MRSA PCR SCREENING     Status: None   Collection Time    09/28/13  3:42 PM      Result Value Range Status  MRSA by PCR NEGATIVE  NEGATIVE Final   Comment:            The GeneXpert MRSA Assay (FDA     approved for NASAL specimens     only), is one component of a     comprehensive MRSA colonization     surveillance program. It is not     intended to diagnose MRSA     infection nor to guide or     monitor treatment for     MRSA infections.  URINE CULTURE     Status: None   Collection Time    09/28/13  3:51 PM      Result Value Range Status   Specimen Description URINE, RANDOM   Final   Special Requests NONE   Final   Culture  Setup Time     Final   Value: 09/28/2013 21:53     Performed at [facility] Count     Final   Value: 75,000 COLONIES/ML     Performed at [facility]   Culture     Final   Value: Multiple bacterial morphotypes present,  none predominant. Suggest appropriate recollection if clinically indicated.     Performed at [facility]   Report Status 09/29/2013 FINAL   Final  CLOSTRIDIUM DIFFICILE BY PCR     Status: Abnormal   Collection Time    09/29/13  7:03 AM      Result Value Range Status   C difficile by pcr POSITIVE (*) NEGATIVE Final   Comment: CRITICAL RESULT CALLED TO, READ BACK BY AND VERIFIED WITH:     BISHOP J.,RN 09/29/13 1148 BY JONESJ     Performed at [facility]     Studies: Dg Chest 2 View  09/28/2013   CLINICAL DATA:  Cough.  EXAM: CHEST  2 VIEW  COMPARISON:  September 05, 2013.  FINDINGS: Hyperexpansion of the lungs is noted consistent with chronic obstructive pulmonary disease. No pleural effusion or pneumothorax is noted. Right internal jugular Port-A-Cath is noted with tip in expected position of the SVC. Bilateral breast implants are noted. No acute pulmonary disease is noted. Cardiomediastinal silhouette appears normal.  IMPRESSION: Chronic obstructive pulmonary disease.  No acute abnormality seen.   Electronically Signed   By: Roque Lias M.D.   On: 09/28/2013 08:34   Ct Abdomen Pelvis W Contrast  09/28/2013   CLINICAL DATA:  Constipation abdominal pain.  EXAM: CT ABDOMEN AND PELVIS WITH CONTRAST  TECHNIQUE: Multidetector CT imaging of the abdomen and pelvis was performed using the standard protocol following bolus administration of intravenous contrast.  CONTRAST:  50mL OMNIPAQUE IOHEXOL 300 MG/ML SOLN, 60mL OMNIPAQUE IOHEXOL 300 MG/ML SOLN  COMPARISON:  PET CT scan 09/11/2013 and CT abdomen 07/18/2013.  FINDINGS: The lung bases are clear. Densely calcified breast implants are noted. There is no pleural or pericardial effusion.  There is marked thickening of the walls of the distal ileum best seen in loops in the pelvis. Extensive infiltration of mesenteric fat within the pelvis is identified There is small amount of scattered abdominal ascites. No focal fluid collection is  present. No pneumatosis, portal venous gas or free intraperitoneal air is identified. More proximal loops of small bowel show air-fluid levels and mild dilation at 3.4 cm. The stomach is unremarkable. The colon is largely decompressed with extensive diverticular disease identified but no evidence of diverticulitis.  As seen on prior examinations, there is a juxta-renal abdominal aortic aneurysm  measuring 4.1 x 4.2 cm, unchanged. No evidence of hemorrhage is identified. The gallbladder, liver, and spleen are unremarkable. Enhancing foci within the liver and pancreas seen on the 07/18/2013 study are not visualized on this examination as no arterial phase imaging was performed. The left kidney has been removed. Compensatory hypertrophy of the right kidney is identified. Scarring in the right kidney consistent with prior tumor ablation is noted. Right renal cysts are present. Uterus and adnexa are unremarkable. The urinary bladder is distended but otherwise unremarkable. Bones demonstrate severe convex left scoliosis within osteoarthritis about the right hip. Old left hip fracture with fixation hardware in place noted. Remote left pubic rami fractures also seen.  IMPRESSION: Abnormal appearance of loops of distal ileum as described above is consistent with enteritis. Although no specific features to suggest ischemia are identified, ischemia cannot be excluded. The CT appearance is most in keeping with an infectious or inflammatory enteritis.  No change in a 4.1 cm juxtarenal abdominal aortic aneurysm.  Solitary right kidney with postoperative change of ablation of the renal tumor.  Critical Value/emergent results were called by telephone at the time of interpretation on 09/28/2013 at 11:59 AM to Dr.IVA KNAPP , who verbally acknowledged these results.   Electronically Signed   By: Drusilla Kanner M.D.   On: 09/28/2013 12:37   Dg Abd 2 Views  09/28/2013   CLINICAL DATA:  Lower abdominal pain. Diarrhea.  EXAM: ABDOMEN  - 2 VIEW  COMPARISON:  CT abdomen pelvis 07/20/2013.  FINDINGS: Few prominent loops of small bowel are demonstrated within the left lower quadrant. Otherwise relative paucity of small bowel gas. No evidence for pneumatosis or free intraperitoneal air. Postsurgical clips within the left upper quadrant. Extensive aortic vascular calcifications with aneurysmal dilation of the mid abdominal aorta. Moderate lower thoracic and lumbar spine degenerative change. Surgical hardware within the left femur. Bilateral hip joint degenerative change.  IMPRESSION: Few nonspecific mildly prominent loops of small bowel within the left lower quadrant without definite evidence for ileus or obstruction.   Electronically Signed   By: Annia Belt M.D.   On: 09/28/2013 08:38    Scheduled Meds: . beta carotene w/minerals  1 tablet Oral QHS  . brimonidine  1 drop Both Eyes QHS  . brinzolamide  1 drop Both Eyes QHS  . ciprofloxacin-dexamethasone  2 drop Right Ear BID  . diltiazem  15 mg Intravenous Once  . feeding supplement (RESOURCE BREEZE)  1 Container Oral TID BM  . heparin  5,000 Units Subcutaneous Q8H  . latanoprost  1 drop Both Eyes QHS  . metoprolol tartrate  25 mg Oral Daily  . metronidazole  500 mg Intravenous Q8H  . protein supplement  1 scoop Oral TID WC  . sodium chloride  500 mL Intravenous Once  . sodium chloride  3 mL Intravenous Q12H   Continuous Infusions: . sodium chloride 100 mL/hr at 09/30/13 0154  . diltiazem (CARDIZEM) infusion 5 mg/hr (09/30/13 1610)    Principal Problem:   Enteritis Active Problems:   COPD (chronic obstructive pulmonary disease)   Tobacco abuse   Laryngeal cancer   Hyponatremia   Abdominal pain, acute, left lower quadrant   Protein-calorie malnutrition, severe  Time spent:  Maleki Hippe K  [facility] Pager [phone]. If 7PM-7AM, please contact night-coverage at [email or web address], password [facility] 09/30/2013, 8:19 AM  LOS: 2 days

## 2013-09-30 NOTE — Consult Note (Signed)
Addendum I noted that is presence of dry mouth and possible yeast infection in her mouth. I recommend we start her on some nystatin swish and swallow.

## 2013-10-01 ENCOUNTER — Other Ambulatory Visit: Payer: Self-pay | Admitting: Hematology and Oncology

## 2013-10-01 ENCOUNTER — Inpatient Hospital Stay (HOSPITAL_COMMUNITY): Payer: Medicare Other

## 2013-10-01 DIAGNOSIS — C329 Malignant neoplasm of larynx, unspecified: Secondary | ICD-10-CM

## 2013-10-01 DIAGNOSIS — K5289 Other specified noninfective gastroenteritis and colitis: Secondary | ICD-10-CM

## 2013-10-01 DIAGNOSIS — K56609 Unspecified intestinal obstruction, unspecified as to partial versus complete obstruction: Secondary | ICD-10-CM

## 2013-10-01 LAB — BASIC METABOLIC PANEL
BUN: 12 mg/dL (ref 6–23)
CO2: 18 mEq/L — ABNORMAL LOW (ref 19–32)
GFR calc Af Amer: >90 mL/min (ref >90)
GFR calc non Af Amer: >90 mL/min (ref >90)
Glucose, Bld: 82 mg/dL (ref 70–99)
Potassium: 2.8 mEq/L — ABNORMAL LOW (ref 3.5–5.1)
Sodium: 132 mEq/L — ABNORMAL LOW (ref 135–145)

## 2013-10-01 LAB — CBC
HCT: 32 % — ABNORMAL LOW (ref 36.0–46.0)
Hemoglobin: 11 g/dL — ABNORMAL LOW (ref 12.0–15.0)
MCH: 30 pg (ref 26.0–34.0)
MCHC: 34.4 g/dL (ref 30.0–36.0)
MCV: 87.2 fL (ref 78.0–100.0)
RBC: 3.67 MIL/uL — ABNORMAL LOW (ref 3.87–5.11)

## 2013-10-01 LAB — GI PATHOGEN PANEL BY PCR, STOOL
C difficile toxin A/B: POSITIVE
Campylobacter by PCR: NEGATIVE
E coli (ETEC) LT/ST: NEGATIVE
E coli (STEC): NEGATIVE
Norovirus GI/GII: NEGATIVE
Salmonella by PCR: NEGATIVE

## 2013-10-01 MED ORDER — MORPHINE SULFATE (CONCENTRATE) 10 MG /0.5 ML PO SOLN: 5.0000 mg | ORAL | Status: DC | PRN

## 2013-10-01 MED ORDER — POLYVINYL ALCOHOL 1.4 % OP SOLN
1.0000 [drp] | Freq: Four times a day (QID) | OPHTHALMIC | Status: DC | PRN
  Filled 2013-10-01: qty 15

## 2013-10-01 MED ORDER — HYDROMORPHONE HCL PF 1 MG/ML IJ SOLN
0.5000 mg | INTRAMUSCULAR | Status: DC | PRN
  Administered 2013-10-02 (×2): 0.5 mg via INTRAVENOUS
  Filled 2013-10-01 (×2): qty 1

## 2013-10-01 MED ORDER — MORPHINE SULFATE 10 MG/ML IJ SOLN: 10.0000 mg | INTRAMUSCULAR | Status: DC | PRN

## 2013-10-01 MED ORDER — MORPHINE (PF) INJECTION FOR INHALATION 10 MG/ML: 10.0000 mg | RESPIRATORY_TRACT | Status: DC | PRN

## 2013-10-01 MED ORDER — SODIUM CHLORIDE 0.9 % IJ SOLN
3.0000 mL | Freq: Two times a day (BID) | INTRAMUSCULAR | Status: DC
  Administered 2013-10-01 – 2013-10-03 (×3): 3 mL via INTRAVENOUS

## 2013-10-01 MED ORDER — POTASSIUM CHLORIDE 10 MEQ/100ML IV SOLN
10.0000 meq | INTRAVENOUS | Status: AC
  Administered 2013-10-01 (×5): 10 meq via INTRAVENOUS
  Filled 2013-10-01 (×5): qty 100

## 2013-10-01 MED ORDER — VANCOMYCIN HCL 500 MG IV SOLR
500.0000 mg | Freq: Four times a day (QID) | Status: DC
  Filled 2013-10-01 (×4): qty 500

## 2013-10-01 MED ORDER — SODIUM CHLORIDE 0.9 % IJ SOLN: 3.0000 mL | INTRAMUSCULAR | Status: DC | PRN

## 2013-10-01 MED ORDER — DIPHENHYDRAMINE HCL 50 MG/ML IJ SOLN: 12.5000 mg | INTRAMUSCULAR | Status: DC | PRN

## 2013-10-01 MED ORDER — LORAZEPAM 1 MG PO TABS: 1.0000 mg | ORAL_TABLET | ORAL | Status: DC | PRN

## 2013-10-01 MED ORDER — SODIUM CHLORIDE 0.9 % IV SOLN: 250.0000 mL | INTRAVENOUS | Status: DC | PRN

## 2013-10-01 NOTE — Progress Notes (Signed)
Family meeting earlier this AM. Pt's SBO has worsened per abd xray. Pt is not tolerating PO vanc and has refused per rectal vanc. Failed a trial of NG to low wall suction. Initially plan was for surgical consult re: SBO. Family meeting then done. Family is aware of overall grim prognosis. Pt had clearly stated to myself as well to family members, "Please, just let me go!" Family is at the bedside and is in agreement with palliation. Comfort care discussed with family, who agrees to proceed. Will d/c monitor and abx, as pt has been refusing thus far. Will cont solely on comfort measures and will allow comfort feeds as tolerated. The above was discussed with the pt's Oncologist, Dr. Bertis Ruddy. Will follow.

## 2013-10-01 NOTE — Progress Notes (Signed)
OT Cancellation Note  Patient Details Name: [name] MRN: [id number] DOB: 1928/02/05   Cancelled Treatment:    Reason Eval/Treat Not Completed: Medical issues which prohibited therapy. Nurse reported pt had rough morning and suggested OT check on pt next day.    [name] 10/01/2013, 1:00 PM

## 2013-10-01 NOTE — Progress Notes (Signed)
[facility] PROGRESS NOTE  [name] ZOX:[id number] DOB: 04/06/1928 DOA: 09/28/2013 PCP: Ezequiel Kayser, MD  Assessment/Plan: C.diff Enteritis with abdominal pain, diarrhea  - Patient initially presented tachycardiac, hr 115-120'S, BP 100/63, frail-appearing and was admitted to step down - Initially placed on ciprofloxacin and Flagyl  - Cipro has since been discontinued after being pos for cdiff - Sx worsened on 10/19 so PO vanc added SBO - Slightly worse as of 10/20. - NG to low wall suction - NPO with supportive care - Overall poor prognosis, but may consider surgical input COPD (chronic obstructive pulmonary disease)  - Currently stable, placed on Xopenex nebs, O2  Tobacco abuse  - Patient counseled on smoking cessation  Laryngeal cancer: Currently undergoing chemotherapy  - Followed by oncology  - Appreciate input. Recs noted Hyponatremia  - Placed on IV fluid hydration  Afib with RVR - Remains on IV cardizem as pt is NPO - Maximize IVF as tolerated - Follow lytes and correct as needed  Code Status: DNR Family Communication: Pt in room Disposition Plan: Pending  Antibiotics:  Ciprofloxacin 09/28/13>>>09/29/13  Flagyl 09/28/13>>>  Vancomycin PO 09/30/13  HPI/Subjective: No acute events noted overnight.   Objective: Filed Vitals:   09/30/13 1445 09/30/13 1608 09/30/13 2016 10/01/13 0659  BP: 115/62 124/75 118/61 133/62  Pulse:  83 81 88  Temp:   97.6 F (36.4 C) 98.4 F (36.9 C)  TempSrc:   Oral Oral  Resp:   20 22  Height:      Weight:      SpO2:   95% 96%    Intake/Output Summary (Last 24 hours) at 10/01/13 0838 Last data filed at 10/01/13 0700  Gross per 24 hour  Intake   1403 ml  Output    850 ml  Net    553 ml   Filed Weights   09/28/13 1400 09/29/13 0400  Weight: 37.7 kg (83 lb 1.8 oz) 35.7 kg (78 lb 11.3 oz)    Exam:   General:  Awake, in nad  Cardiovascular: regular, s1, s2  Respiratory: normal resp effort, no  wheezing  Abdomen: soft, normal BS  Musculoskeletal: perfused, no clubbing   Data Reviewed: Basic Metabolic Panel:  Recent Labs Lab 09/26/13 1239 09/28/13 0746 09/29/13 0355 09/30/13 0823  NA 132* 127* 125* 128*  K 5.4* 4.0 3.9 3.3*  CL  --  97 98 100  CO2 27 20 19  17*  GLUCOSE 118 124* 144* 112*  BUN 31.4* 27* 17 13  CREATININE 0.8 0.51 0.50 0.40*  CALCIUM 8.8 8.1* 6.8* 7.0*  MG  --   --   --  1.9   Liver Function Tests:  Recent Labs Lab 09/26/13 1239 09/28/13 0746  AST 24 21  ALT 32 30  ALKPHOS 73 80  BILITOT 0.27 0.4  PROT 7.8 6.8  ALBUMIN 2.5* 2.5*   No results found for this basename: LIPASE, AMYLASE,  in the last 168 hours No results found for this basename: AMMONIA,  in the last 168 hours CBC:  Recent Labs Lab 09/26/13 1239 09/28/13 0746 09/29/13 0355 09/30/13 0823  WBC 15.5* 3.5* 6.0 8.8  NEUTROABS 14.1* 3.1  --  8.0*  HGB 13.2 14.7 11.7* 12.5  HCT 39.6 42.6 34.4* 36.1  MCV 90.8 88.6 88.4 87.4  PLT 505* 365 246 321   Cardiac Enzymes: No results found for this basename: CKTOTAL, CKMB, CKMBINDEX, TROPONINI,  in the last 168 hours BNP (last 3 results) No results found for this basename: PROBNP,  in the last 8760 hours CBG: No results found for this basename: GLUCAP,  in the last 168 hours  Recent Results (from the past 240 hour(s))  MRSA PCR SCREENING     Status: None   Collection Time    09/28/13  3:42 PM      Result Value Range Status   MRSA by PCR NEGATIVE  NEGATIVE Final   Comment:            The GeneXpert MRSA Assay (FDA     approved for NASAL specimens     only), is one component of a     comprehensive MRSA colonization     surveillance program. It is not     intended to diagnose MRSA     infection nor to guide or     monitor treatment for     MRSA infections.  URINE CULTURE     Status: None   Collection Time    09/28/13  3:51 PM      Result Value Range Status   Specimen Description URINE, RANDOM   Final   Special Requests  NONE   Final   Culture  Setup Time     Final   Value: 09/28/2013 21:53     Performed at [facility] Count     Final   Value: 75,000 COLONIES/ML     Performed at [facility]   Culture     Final   Value: Multiple bacterial morphotypes present, none predominant. Suggest appropriate recollection if clinically indicated.     Performed at [facility]   Report Status 09/29/2013 FINAL   Final  CLOSTRIDIUM DIFFICILE BY PCR     Status: Abnormal   Collection Time    09/29/13  7:03 AM      Result Value Range Status   C difficile by pcr POSITIVE (*) NEGATIVE Final   Comment: CRITICAL RESULT CALLED TO, READ BACK BY AND VERIFIED WITH:     BISHOP J.,RN 09/29/13 1148 BY JONESJ     Performed at [facility]  STOOL CULTURE     Status: None   Collection Time    09/29/13  7:04 AM      Result Value Range Status   Specimen Description STOOL   Final   Special Requests NONE   Final   Culture     Final   Value: Culture reincubated for better growth     Performed at [facility]   Report Status PENDING   Incomplete     Studies: Dg Abd Portable 1v  10/01/2013   CLINICAL DATA:  Small bowel obstruction  EXAM: PORTABLE ABDOMEN - 1 VIEW  COMPARISON:  September 30, 2013 the radiograph as well as CT abdomen and pelvis September 28, 2013  FINDINGS: Generalized bowel dilatation persists with overall slight increase in small bowel dilatation in the left abdomen. No free air. There is extensive calcification in the aorta with dilatation proximally, grossly stable. There are surgical clips in upper abdomen. There is postoperative change in the left hip region. There is narrowing of both hip joints.  IMPRESSION: Persistent generalized small bowel dilatation with slight increase in dilatation in the left abdomen. No free air. Differential considerations include persistent obstruction and ileus. Persistent bowel obstruction is felt to be more likely given the recent imaging  examinations.   Electronically Signed   By: Bretta Bang M.D.   On: 10/01/2013 07:29   Dg Abd Portable 1v  09/30/2013   CLINICAL DATA:  Abdominal pain  EXAM: PORTABLE ABDOMEN - 1 VIEW  COMPARISON:  09/28/2013  FINDINGS: Disproportionally dilated small bowel loops extend across the abdomen. Mild distention of: Marland Kitchen No obvious free intraperitoneal gas. Postoperative, posttraumatic, and degenerative changes of the bony structures are stable.  IMPRESSION: Stable partial small bowel obstruction pattern.   Electronically Signed   By: Maryclare Bean M.D.   On: 09/30/2013 09:14    Scheduled Meds: . brimonidine  1 drop Both Eyes QHS  . brinzolamide  1 drop Both Eyes QHS  . ciprofloxacin-dexamethasone  2 drop Right Ear BID  . feeding supplement (RESOURCE BREEZE)  1 Container Oral TID BM  . heparin  5,000 Units Subcutaneous Q8H  . latanoprost  1 drop Both Eyes QHS  . metoprolol tartrate  25 mg Oral Daily  . metronidazole  500 mg Intravenous Q8H  . nystatin  5 mL Oral QID  . saccharomyces boulardii  250 mg Oral BID  . sodium chloride  3 mL Intravenous Q12H  . vancomycin  125 mg Oral Q6H   Continuous Infusions: . sodium chloride 100 mL/hr at 09/30/13 2100  . diltiazem (CARDIZEM) infusion 5 mg/hr (10/01/13 0716)    Principal Problem:   Enteritis Active Problems:   COPD (chronic obstructive pulmonary disease)   Tobacco abuse   Laryngeal cancer   Hyponatremia   Abdominal pain, acute, left lower quadrant   Protein-calorie malnutrition, severe  Time spent:  CHIU, STEPHEN K  [facility] Pager [phone]. If 7PM-7AM, please contact night-coverage at [email or web address], password [facility] 10/01/2013, 8:38 AM  LOS: 3 days

## 2013-10-01 NOTE — Care Management Note (Addendum)
    Page 1 of 2   10/02/2013     1:29:39 PM   CARE MANAGEMENT NOTE 10/02/2013  Patient:  [name], [name]   Account Number:  [id number]  Date Initiated:  09/28/2013  Documentation initiated by:  DAVIS,RHONDA  Subjective/Objective Assessment:   elderly patient with enteritis, na 127, elevated K, wbc 2.4, diarrhea     Action/Plan:   lives at home with spouse   Anticipated DC Date:  10/04/2013   Anticipated DC Plan:  HOME W HOME HEALTH SERVICES  In-house referral  NA      DC Planning Services  CM consult      [facility] Choice  NA   Choice offered to / List presented to:  NA   DME arranged  NA      DME agency  NA     HH arranged  NA      HH agency  NA   Status of service:  In process, will continue to follow Medicare Important Message given?  NA - LOS <3 / Initial given by admissions (If response is "NO", the following Medicare IM given date fields will be blank) Date Medicare IM given:   Date Additional Medicare IM given:    Discharge Disposition:    Per UR Regulation:  Reviewed for med. necessity/level of care/duration of stay  If discussed at Long Length of Stay Meetings, dates discussed:    Comments:  10/02/13 Jaystin Mcgarvey RN,BSN NCM [phone] AWAIT PALLIATIVE CARE MEETING RECOMMENDATIONS.  10/01/13 Mahreen Schewe RN,BSN NCM [phone] SBO WORSENING.D/C IV ABX,PO VANC.PER MD RECOMMENDING COMFORT CARE.RECOMMENDED PALLIATIVE CONS.CURRENT D/C PLAN HOME. TRANSFER FROM SDU.SBO,NGT,RECTOCELE,C DIFF+,AFIB-CARDIZEM GTT,IV FLAGYL,K 3.3-KRUN.PT-HH.WILL OFFER CHOICE OF HHC AGENCIES.  [id number] Davis,RN,BSN,CCM

## 2013-10-01 NOTE — Progress Notes (Signed)
[name]   DOB:11-05-1928   ZO#:[id number]    Subjective: The patient experienced general health deterioration. Her bowel obstruction/enteritis is worse. The patient desired to be transitioning to comfort care. She denies any pain. No nausea.  Objective:  Filed Vitals:   10/01/13 1036  BP: 136/57  Pulse: 87  Temp: 98.3 F (36.8 C)  Resp: 24     Intake/Output Summary (Last 24 hours) at 10/01/13 1330 Last data filed at 10/01/13 0900  Gross per 24 hour  Intake   1303 ml  Output    850 ml  Net    453 ml    GENERAL:alert, no distress and comfortable NEURO: alert & oriented x 3 with fluent speech, no focal motor/sensory deficits   Labs:  Lab Results  Component Value Date   WBC 10.2 10/01/2013   HGB 11.0* 10/01/2013   HCT 32.0* 10/01/2013   MCV 87.2 10/01/2013   PLT 305 10/01/2013   NEUTROABS 8.0* 09/30/2013   Studies:  Dg Abd Portable 1v  10/01/2013   CLINICAL DATA:  Small bowel obstruction  EXAM: PORTABLE ABDOMEN - 1 VIEW  COMPARISON:  September 30, 2013 the radiograph as well as CT abdomen and pelvis September 28, 2013  FINDINGS: Generalized bowel dilatation persists with overall slight increase in small bowel dilatation in the left abdomen. No free air. There is extensive calcification in the aorta with dilatation proximally, grossly stable. There are surgical clips in upper abdomen. There is postoperative change in the left hip region. There is narrowing of both hip joints.  IMPRESSION: Persistent generalized small bowel dilatation with slight increase in dilatation in the left abdomen. No free air. Differential considerations include persistent obstruction and ileus. Persistent bowel obstruction is felt to be more likely given the recent imaging examinations.   Electronically Signed   By: Bretta Bang M.D.   On: 10/01/2013 07:29   Dg Abd Portable 1v  09/30/2013   CLINICAL DATA:  Abdominal pain  EXAM: PORTABLE ABDOMEN - 1 VIEW  COMPARISON:  09/28/2013  FINDINGS:  Disproportionally dilated small bowel loops extend across the abdomen. Mild distention of: Marland Kitchen No obvious free intraperitoneal gas. Postoperative, posttraumatic, and degenerative changes of the bony structures are stable.  IMPRESSION: Stable partial small bowel obstruction pattern.   Electronically Signed   By: Maryclare Bean M.D.   On: 09/30/2013 09:14    Assessment: C. difficile colitis with evidence of bowel obstruction  Plan:  #1 laryngeal cancer The patient desired to be transitioned to comfort care. We will discontinue her followup at the cancer Center #2 bowel obstruction #3 C. difficile colitis The patient is not improving clinically. She desired to stop all treatment. I discussed this with her primary hospitalist. He agreed to transition her to comfort care.   Victoria Weida, MD 10/01/2013  1:30 PM

## 2013-10-01 NOTE — Progress Notes (Signed)
Thank you for consulting the Palliative Medicine Team at [facility] to meet your patient's and family's needs.   The reason that you asked [place] to see your patient is for EOL issues, affirm goals and hospice disposition options  We have scheduled your patient for a meeting: Tuesday 10/21 @ 11-11:30am  The Surrogate decision maker is: husband Kadajah Kjos c: [phone]  Other family members that need to be present: patient's daughters to be called by Mr Danley  Your patient is able to participate: patient alert on this visit - stated she didn't want to hear about options for treatment when she heard this discussion could help with decisions for her comfort she was agreeable to meeting   Additional Narrative: spoke with patient and husband who had just finished speaking with Dr Darcus Austin; Mr Ruberg indicated they would like to further discuss with PMT use of medications for symptom management and options should patient need to leave the hospital -and he would like his daughters who can be present to be here- meeting scheduled for tomorrow, Tuesday 10/21 @ 11-11-:30 am  Valente David, RN 10/01/2013, 6:33 PM Palliative Medicine Team RN Liaison [phone]

## 2013-10-02 DIAGNOSIS — R0609 Other forms of dyspnea: Secondary | ICD-10-CM

## 2013-10-02 DIAGNOSIS — R0600 Dyspnea, unspecified: Secondary | ICD-10-CM | POA: Diagnosis present

## 2013-10-02 DIAGNOSIS — J449 Chronic obstructive pulmonary disease, unspecified: Secondary | ICD-10-CM

## 2013-10-02 DIAGNOSIS — F411 Generalized anxiety disorder: Secondary | ICD-10-CM | POA: Diagnosis present

## 2013-10-02 DIAGNOSIS — R627 Adult failure to thrive: Secondary | ICD-10-CM

## 2013-10-02 DIAGNOSIS — Z515 Encounter for palliative care: Secondary | ICD-10-CM

## 2013-10-02 DIAGNOSIS — J4489 Other specified chronic obstructive pulmonary disease: Secondary | ICD-10-CM

## 2013-10-02 MED ORDER — SCOPOLAMINE 1 MG/3DAYS TD PT72
1.0000 | MEDICATED_PATCH | TRANSDERMAL | Status: DC
  Administered 2013-10-02: 1.5 mg via TRANSDERMAL
  Filled 2013-10-02: qty 1

## 2013-10-02 MED ORDER — LORAZEPAM 2 MG/ML IJ SOLN: 1.0000 mg | INTRAMUSCULAR | Status: DC | PRN

## 2013-10-02 MED ORDER — MORPHINE SULFATE 2 MG/ML IJ SOLN
1.0000 mg | INTRAMUSCULAR | Status: DC | PRN
  Administered 2013-10-02 – 2013-10-03 (×6): 1 mg via INTRAVENOUS
  Filled 2013-10-02 (×7): qty 1

## 2013-10-02 NOTE — Consult Note (Signed)
Patient [name]      DOB: Oct 27, 1928      EAV:[id number]     Consult Note from the Palliative Medicine Team at [facility]    Consult Requested by: Dr Red Christians     PCP: Ezequiel Kayser, MD Reason for Consultation:Clarification of GOC and options     Phone Number:[phone]  Assessment of patients Current state: Patient is 79 year old female who was recently diagnosed with laryngeal cancer, last month, known history of AAA, nicotine abuse, history of renal cancer s/p nephrectomy presented to [facility] year with severe abdominal pain with diarrhea this morning. Patient was recently diagnosed with laryngeal cancer, started chemotherapy last month with Dr. Bertis Ruddy (Oncology), seen by oncology on 09/26/2013. No further medical treatment desired for any disease process.  Meeting for anticipatory care needs.  Family in full support for comfort   Admitted on 09-28-13 with abdominal pain, diarrhea CT abdomen pelvis in the ER showed enteritis in the loops of distal ileum rule out ischemia, no change in 4.1 cm AAA    Consult is for review of medical treatment options, clarification of goals of care and end of life issues, disposition and options, and symptom recommendation.  This NP Lorinda Creed reviewed medical records, received report from team, assessed the patient and then meet at the patient's bedside along with her husband Ilana Prezioso # [phone], daughter Almyra Free Rymer # [phone], Emeline Gins # [phone]  to discuss diagnosis prognosis, GOC, EOL wishes disposition and options.  A detailed discussion was had today regarding advanced directives.  Concepts specific to code status, artifical feeding and hydration, continued IV antibiotics and rehospitalization was had.  The difference between a aggressive medical intervention path  and a palliative comfort care path for this patient at this time was had.  Values and goals of care important to patient and family were attempted to be  elicited.  Concept of Hospice and Palliative Care were discussed  Natural trajectory and expectations at EOL were discussed.  Questions and concerns addressed.  Hard Choices booklet left for review. Family encouraged to call with questions or concerns.  PMT will continue to support holistically.       Goals of Care: 1.  Code Status:DNR/DNI-comfort is main focus of care   2. Scope of Treatment: 1. Vital Signs:daily  2. Respiratory/Oxygen:forcomfort 3. Nutritional Support/Tube Feeds:no artifaicl fedding now or in the future 4. Antibiotics:none 5. Blood Products:none 6. NUU:VOZD 7. Review of Medications to be discontinued:minimize for comfort 8. Labs:none 9. Telemetry:none   4. Disposition: Hopeful for residential hospice will write for choice.   3. Symptom Management:   1. Anxiety/Agitation: Ativan 1 mg every 4 hrs prn 2. Pain:/Dyspnea: Morphine 2 mg every hr prn 3. Terminal Secretions: Scopolamine patch as directed 4. Failure to thrive  4. Psychosocial:  Emotional support offered to patient and family.  Patient states "this is taking too long".   Family able to share support of and acknowledge purpose to this EOL timeframe.   5. Spiritual: [employer] support    Patient Documents Completed or Given: Document Given Completed  Advanced Directives Pkt    MOST    DNR    Gone from My Sight    Hard Choices yes     Brief GUY:[name] is 79 year old female who was recently diagnosed with laryngeal cancer, last month, known history of AAA, nicotine abuse, history of renal cancer s/p nephrectomy presented to [facility] year with severe abdominal pain with diarrhea this morning.CT abdomen pelvis in the  ER showed enteritis in the loops of distal ileum rule out ischemia, no change in 4.1 cm AAA   Patient was recently diagnosed with laryngeal cancer, started chemotherapy last month with Dr. Bertis Ruddy (Oncology), seen by oncology on 09/26/2013. No further medical treatment  desired for any disease process.  Meeting for anticipatory care needs.  Family in full support for comfort Overall failure to thrive, only tolerateing sips, PPS 20%    ROS: weakness and "ready to die"    PMH:  Past Medical History  Diagnosis Date  . Aortic atherosclerosis   . PAD (peripheral artery disease)   . COPD (chronic obstructive pulmonary disease)   . HTN (hypertension)   . Hypercholesteremia   . Glaucoma   . Bronchitis   . Fractured coccyx   . AAA (abdominal aortic aneurysm)   . PAD (peripheral artery disease)   . Renal cell carcinoma 1995    Kidney removal  . Tobacco abuse 10/27/2012  . Laryngeal cancer 09/12/2013     PSH: Past Surgical History  Procedure Laterality Date  . Nephrectomy      LEFT/CANCER  . Renal cryoablation      Right with radiation  . Hip fracture surgery  2009  . Tonsillectomy    . Breast surgery      breast implants  . Colonoscopy    . Direct laryngoscopy N/A 09/07/2013    Procedure: DIRECT LARYNGOSCOPY AND DIRECT LARYNGEAL BIOPSIES;  Surgeon: Osborn Coho, MD;  Location: [facility] OR;  Service: ENT;  Laterality: N/A;   I have reviewed the FH and SH and  If appropriate update it with new information. Allergies  Allergen Reactions  . Tape     Plastic tape tears skin   Scheduled Meds: . brimonidine  1 drop Both Eyes QHS  . brinzolamide  1 drop Both Eyes QHS  . ciprofloxacin-dexamethasone  2 drop Right Ear BID  . latanoprost  1 drop Both Eyes QHS  . scopolamine  1 patch Transdermal Q72H  . sodium chloride  3 mL Intravenous Q12H   Continuous Infusions:  PRN Meds:.sodium chloride, acetaminophen, acetaminophen, diphenhydrAMINE, HYDROmorphone (DILAUDID) injection, levalbuterol, LORazepam, morphine CONCENTRATE, morphine CONCENTRATE, morphine, polyvinyl alcohol, sodium chloride    BP 149/63  Pulse 95  Temp(Src) 97.9 F (36.6 C) (Oral)  Resp 24  Ht 5\' 2"  (1.575 m)  Wt 35.7 kg (78 lb 11.3 oz)  BMI 14.39 kg/m2  SpO2 92%    PPS:20%   Intake/Output Summary (Last 24 hours) at 10/02/13 1020 Last data filed at 10/02/13 0700  Gross per 24 hour  Intake    200 ml  Output    800 ml  Net   -600 ml    Physical Exam:  General: frail, ill appearing, cachectic female  HEENT:  Dry buccal membranes Chest:   diminished in bases, CTA CVS: RRR Abdomen:soft NT +BS Ext:  Without edema Neuro:alert and oriented X3 and to situation, fully engaged in decsions  Labs: CBC    Component Value Date/Time   WBC 10.2 10/01/2013 0950   WBC 15.5* 09/26/2013 1239   RBC 3.67* 10/01/2013 0950   RBC 4.36 09/26/2013 1239   HGB 11.0* 10/01/2013 0950   HGB 13.2 09/26/2013 1239   HCT 32.0* 10/01/2013 0950   HCT 39.6 09/26/2013 1239   PLT 305 10/01/2013 0950   PLT 505* 09/26/2013 1239   MCV 87.2 10/01/2013 0950   MCV 90.8 09/26/2013 1239   MCH 30.0 10/01/2013 0950   MCH 30.3 09/26/2013 1239   MCHC  34.4 10/01/2013 0950   MCHC 33.4 09/26/2013 1239   RDW 13.8 10/01/2013 0950   RDW 13.2 09/26/2013 1239   LYMPHSABS 0.4* 09/30/2013 0823   LYMPHSABS 0.7* 09/26/2013 1239   MONOABS 0.4 09/30/2013 0823   MONOABS 0.6 09/26/2013 1239   EOSABS 0.0 09/30/2013 0823   EOSABS 0.0 09/26/2013 1239   BASOSABS 0.0 09/30/2013 0823   BASOSABS 0.1 09/26/2013 1239    BMET    Component Value Date/Time   NA 132* 10/01/2013 0950   NA 132* 09/26/2013 1239   K 2.8* 10/01/2013 0950   K 5.4* 09/26/2013 1239   CL 103 10/01/2013 0950   CO2 18* 10/01/2013 0950   CO2 27 09/26/2013 1239   GLUCOSE 82 10/01/2013 0950   GLUCOSE 118 09/26/2013 1239   BUN 12 10/01/2013 0950   BUN 31.4* 09/26/2013 1239   CREATININE 0.44* 10/01/2013 0950   CREATININE 0.8 09/26/2013 1239   CREATININE 0.64 06/27/2013 1138   CALCIUM 7.0* 10/01/2013 0950   CALCIUM 8.8 09/26/2013 1239   GFRNONAA >90 10/01/2013 0950   GFRAA >90 10/01/2013 0950    CMP     Component Value Date/Time   NA 132* 10/01/2013 0950   NA 132* 09/26/2013 1239   K 2.8* 10/01/2013 0950   K  5.4* 09/26/2013 1239   CL 103 10/01/2013 0950   CO2 18* 10/01/2013 0950   CO2 27 09/26/2013 1239   GLUCOSE 82 10/01/2013 0950   GLUCOSE 118 09/26/2013 1239   BUN 12 10/01/2013 0950   BUN 31.4* 09/26/2013 1239   CREATININE 0.44* 10/01/2013 0950   CREATININE 0.8 09/26/2013 1239   CREATININE 0.64 06/27/2013 1138   CALCIUM 7.0* 10/01/2013 0950   CALCIUM 8.8 09/26/2013 1239   PROT 6.8 09/28/2013 0746   PROT 7.8 09/26/2013 1239   ALBUMIN 2.5* 09/28/2013 0746   ALBUMIN 2.5* 09/26/2013 1239   AST 21 09/28/2013 0746   AST 24 09/26/2013 1239   ALT 30 09/28/2013 0746   ALT 32 09/26/2013 1239   ALKPHOS 80 09/28/2013 0746   ALKPHOS 73 09/26/2013 1239   BILITOT 0.4 09/28/2013 0746   BILITOT 0.27 09/26/2013 1239   GFRNONAA >90 10/01/2013 0950   GFRAA >90 10/01/2013 0950     Time In Time Out Total Time Spent with Patient Total Overall Time  1100 1215 70 min 75 min    Greater than 50%  of this time was spent counseling and coordinating care related to the above assessment and plan.  Lorinda Creed NP  Palliative Medicine Team Team Phone # [phone] Pager [phone]  Discussed with Dr Rhona Leavens

## 2013-10-02 NOTE — Progress Notes (Signed)
Clinical Social Work Department BRIEF PSYCHOSOCIAL ASSESSMENT 10/02/2013  Patient:  [name], [name]     Account Number:  [id number]     Admit date:  09/28/2013  Clinical Social Worker:  Jacelyn Grip  Date/Time:  10/02/2013 03:30 PM  Referred by:  Physician  Date Referred:  10/02/2013 Referred for  Residential hospice placement   Other Referral:   Interview type:  Family Other interview type:    PSYCHOSOCIAL DATA Living Status:  HUSBAND Admitted from facility:   Level of care:   Primary support name:  [name]/[phone] Primary support relationship to patient:  SPOUSE Degree of support available:   strong    CURRENT CONCERNS Current Concerns  Post-Acute Placement   Other Concerns:    SOCIAL WORK ASSESSMENT / PLAN CSW received referral for residential hospice placement.    CSW met with pt, pt spouse, and pt three daughters at bedside. CSW discussed residential hospice placement process and provided list of residential hospice facilities. Pt family preference is [facility]. CSW discussed that it will be beneficial to have second choice in mind if [facility] is full. Pt family states that they would review list, but likely second choice would be [facility] if they had to choose second option.    CSW made referral to [facility], [name] and prepared pt family that [facility] will likely visit pt and pt family.    Unit CSW to continue to follow to assist with residential hospice placement.   Assessment/plan status:  Psychosocial Support/Ongoing Assessment of Needs Other assessment/ plan:   discharge planning   Information/referral to community resources:   Residential Hospice List    PATIENT'S/FAMILY'S RESPONSE TO PLAN OF CARE: Pt alert and oriented x 4, but resting comfortably during this CSW visit. Pt has very supportive family who appear to be coping appropriately with decision to explore residential hospice for  patient.    [name], MSW, LCSWA  Clinical Social Work Coverage for [employer], [place] [phone]

## 2013-10-02 NOTE — Progress Notes (Signed)
Courtesy visit: patient sleeping, appeared comfortable. Family member nearby, has no new concerns. Continue on comfort care only

## 2013-10-02 NOTE — Progress Notes (Signed)
[facility] PROGRESS NOTE  [name] AVW:[id number] DOB: 06-16-1928 DOA: 09/28/2013 PCP: Ezequiel Kayser, MD  Assessment/Plan: C.diff Enteritis with abdominal pain, diarrhea  - Patient initially presented tachycardiac, hr 115-120'S, BP 100/63, frail-appearing and was admitted to step down - Initially placed on ciprofloxacin and Flagyl  - Cipro was discontinued after being pos for cdiff - Sx worsened on 10/19 so PO vanc added - Vanc converted to Per rectal when she developed SBO (see below), but had refused - Pt now comfort care - see below - Now off antibiotics as pt has been refusing SBO - Slightly worse as of 10/20. - Not improved w/ NG to low wall suction - Pt has desired comfort measures only - discussed with family and pt's Oncologist COPD (chronic obstructive pulmonary disease)  - Nebs and O2 as needed  Tobacco abuse    Laryngeal cancer: Currently undergoing chemotherapy  - Followed by oncology  - Appreciate input. Recs noted - No more outpatient follow ups planned Hyponatremia  - Was initially continued on IV fluid hydration  - No more blood draws as pt is on comfort measures Afib with RVR - Was back to NSR yesterday - Remained NSR off cardizem so stopped, especially as pt's BP was also soft  Code Status: DNR - comfort Family Communication: Pt in room Disposition Plan: Pending - Palliative Care meeting this AM  Antibiotics:  Ciprofloxacin 09/28/13>>>09/29/13  Flagyl 09/28/13>>>10/01/13  Vancomycin PO 09/30/13>>>10/01/13  HPI/Subjective: No acute events noted overnight. Complains of intermittent nausea.  Objective: Filed Vitals:   10/01/13 1036 10/01/13 1300 10/01/13 2143 10/02/13 0526  BP: 136/57 136/61 153/70 149/63  Pulse: 87 84 87 95  Temp: 98.3 F (36.8 C) 98.1 F (36.7 C) 98 F (36.7 C) 97.9 F (36.6 C)  TempSrc: Oral Oral Oral Oral  Resp: 24 22 20 24   Height:      Weight:      SpO2: 96% 95% 97% 92%    Intake/Output Summary (Last  24 hours) at 10/02/13 0801 Last data filed at 10/02/13 0700  Gross per 24 hour  Intake    200 ml  Output    800 ml  Net   -600 ml   Filed Weights   09/28/13 1400 09/29/13 0400  Weight: 37.7 kg (83 lb 1.8 oz) 35.7 kg (78 lb 11.3 oz)    Exam:   General:  Awake, in nad  Cardiovascular: regular, s1, s2  Respiratory: normal resp effort, no wheezing  Abdomen: soft, normal BS  Musculoskeletal: perfused, no clubbing   Data Reviewed: Basic Metabolic Panel:  Recent Labs Lab 09/26/13 1239 09/28/13 0746 09/29/13 0355 09/30/13 0823 10/01/13 0950  NA 132* 127* 125* 128* 132*  K 5.4* 4.0 3.9 3.3* 2.8*  CL  --  97 98 100 103  CO2 27 20 19  17* 18*  GLUCOSE 118 124* 144* 112* 82  BUN 31.4* 27* 17 13 12   CREATININE 0.8 0.51 0.50 0.40* 0.44*  CALCIUM 8.8 8.1* 6.8* 7.0* 7.0*  MG  --   --   --  1.9  --    Liver Function Tests:  Recent Labs Lab 09/26/13 1239 09/28/13 0746  AST 24 21  ALT 32 30  ALKPHOS 73 80  BILITOT 0.27 0.4  PROT 7.8 6.8  ALBUMIN 2.5* 2.5*   No results found for this basename: LIPASE, AMYLASE,  in the last 168 hours No results found for this basename: AMMONIA,  in the last 168 hours CBC:  Recent Labs Lab 09/26/13 1239  09/28/13 0746 09/29/13 0355 09/30/13 0823 10/01/13 0950  WBC 15.5* 3.5* 6.0 8.8 10.2  NEUTROABS 14.1* 3.1  --  8.0*  --   HGB 13.2 14.7 11.7* 12.5 11.0*  HCT 39.6 42.6 34.4* 36.1 32.0*  MCV 90.8 88.6 88.4 87.4 87.2  PLT 505* 365 246 321 305   Cardiac Enzymes: No results found for this basename: CKTOTAL, CKMB, CKMBINDEX, TROPONINI,  in the last 168 hours BNP (last 3 results) No results found for this basename: PROBNP,  in the last 8760 hours CBG: No results found for this basename: GLUCAP,  in the last 168 hours  Recent Results (from the past 240 hour(s))  MRSA PCR SCREENING     Status: None   Collection Time    09/28/13  3:42 PM      Result Value Range Status   MRSA by PCR NEGATIVE  NEGATIVE Final   Comment:             The GeneXpert MRSA Assay (FDA     approved for NASAL specimens     only), is one component of a     comprehensive MRSA colonization     surveillance program. It is not     intended to diagnose MRSA     infection nor to guide or     monitor treatment for     MRSA infections.  URINE CULTURE     Status: None   Collection Time    09/28/13  3:51 PM      Result Value Range Status   Specimen Description URINE, RANDOM   Final   Special Requests NONE   Final   Culture  Setup Time     Final   Value: 09/28/2013 21:53     Performed at [facility] Count     Final   Value: 75,000 COLONIES/ML     Performed at [facility]   Culture     Final   Value: Multiple bacterial morphotypes present, none predominant. Suggest appropriate recollection if clinically indicated.     Performed at [facility]   Report Status 09/29/2013 FINAL   Final  CLOSTRIDIUM DIFFICILE BY PCR     Status: Abnormal   Collection Time    09/29/13  7:03 AM      Result Value Range Status   C difficile by pcr POSITIVE (*) NEGATIVE Final   Comment: CRITICAL RESULT CALLED TO, READ BACK BY AND VERIFIED WITH:     BISHOP J.,RN 09/29/13 1148 BY JONESJ     Performed at [facility]  STOOL CULTURE     Status: None   Collection Time    09/29/13  7:04 AM      Result Value Range Status   Specimen Description STOOL   Final   Special Requests NONE   Final   Culture     Final   Value: NO SUSPICIOUS COLONIES, CONTINUING TO HOLD     Performed at [facility]   Report Status PENDING   Incomplete     Studies: Dg Abd Portable 1v  10/01/2013   CLINICAL DATA:  Small bowel obstruction  EXAM: PORTABLE ABDOMEN - 1 VIEW  COMPARISON:  September 30, 2013 the radiograph as well as CT abdomen and pelvis September 28, 2013  FINDINGS: Generalized bowel dilatation persists with overall slight increase in small bowel dilatation in the left abdomen. No free air. There is extensive calcification in the aorta  with dilatation proximally, grossly  stable. There are surgical clips in upper abdomen. There is postoperative change in the left hip region. There is narrowing of both hip joints.  IMPRESSION: Persistent generalized small bowel dilatation with slight increase in dilatation in the left abdomen. No free air. Differential considerations include persistent obstruction and ileus. Persistent bowel obstruction is felt to be more likely given the recent imaging examinations.   Electronically Signed   By: Bretta Bang M.D.   On: 10/01/2013 07:29   Dg Abd Portable 1v  09/30/2013   CLINICAL DATA:  Abdominal pain  EXAM: PORTABLE ABDOMEN - 1 VIEW  COMPARISON:  09/28/2013  FINDINGS: Disproportionally dilated small bowel loops extend across the abdomen. Mild distention of: Marland Kitchen No obvious free intraperitoneal gas. Postoperative, posttraumatic, and degenerative changes of the bony structures are stable.  IMPRESSION: Stable partial small bowel obstruction pattern.   Electronically Signed   By: Maryclare Bean M.D.   On: 09/30/2013 09:14    Scheduled Meds: . brimonidine  1 drop Both Eyes QHS  . brinzolamide  1 drop Both Eyes QHS  . ciprofloxacin-dexamethasone  2 drop Right Ear BID  . latanoprost  1 drop Both Eyes QHS  . sodium chloride  3 mL Intravenous Q12H   Continuous Infusions:    Principal Problem:   Enteritis Active Problems:   COPD (chronic obstructive pulmonary disease)   Tobacco abuse   Laryngeal cancer   Hyponatremia   Abdominal pain, acute, left lower quadrant   Protein-calorie malnutrition, severe  Time spent:  Luverna Degenhart K  [facility] Pager [phone]. If 7PM-7AM, please contact night-coverage at [email or web address], password [facility] 10/02/2013, 8:01 AM  LOS: 4 days

## 2013-10-03 ENCOUNTER — Ambulatory Visit: Payer: Medicare Other

## 2013-10-03 ENCOUNTER — Ambulatory Visit: Payer: Medicare Other | Admitting: Hematology and Oncology

## 2013-10-03 ENCOUNTER — Other Ambulatory Visit: Payer: Medicare Other | Admitting: Lab

## 2013-10-03 DIAGNOSIS — K56609 Unspecified intestinal obstruction, unspecified as to partial versus complete obstruction: Secondary | ICD-10-CM | POA: Diagnosis present

## 2013-10-03 DIAGNOSIS — I4891 Unspecified atrial fibrillation: Secondary | ICD-10-CM | POA: Diagnosis present

## 2013-10-03 LAB — STOOL CULTURE

## 2013-10-03 LAB — OVA AND PARASITE EXAMINATION: Ova and parasites: NONE SEEN

## 2013-10-03 MED ORDER — POLYVINYL ALCOHOL 1.4 % OP SOLN: 1.0000 [drp] | Freq: Four times a day (QID) | OPHTHALMIC | Status: AC | PRN

## 2013-10-03 MED ORDER — MORPHINE SULFATE 2 MG/ML IJ SOLN: 1.0000 mg | INTRAMUSCULAR | Status: AC | PRN

## 2013-10-03 MED ORDER — HEPARIN SOD (PORK) LOCK FLUSH 100 UNIT/ML IV SOLN
500.0000 [IU] | Freq: Once | INTRAVENOUS | Status: DC
  Filled 2013-10-03: qty 5

## 2013-10-03 MED ORDER — LORAZEPAM 2 MG/ML IJ SOLN: 1.0000 mg | INTRAMUSCULAR | Status: AC | PRN

## 2013-10-03 MED ORDER — MORPHINE SULFATE (CONCENTRATE) 10 MG /0.5 ML PO SOLN: 5.0000 mg | ORAL | Status: AC | PRN

## 2013-10-03 MED ORDER — SCOPOLAMINE 1 MG/3DAYS TD PT72: 1.0000 | MEDICATED_PATCH | TRANSDERMAL | Status: AC

## 2013-10-03 MED ORDER — LEVALBUTEROL HCL 0.63 MG/3ML IN NEBU: 0.6300 mg | INHALATION_SOLUTION | Freq: Four times a day (QID) | RESPIRATORY_TRACT | Status: AC | PRN

## 2013-10-03 NOTE — Progress Notes (Signed)
CSW received notification this morning from [facility] [name] that pt has bed available at [facility] today.   CSW notified MD.  CSW facilitated pt discharge needs including contacting facility, faxing pt discharge information to [facility], discussing with pt and pt family at bedside, providing RN phone number to call report, and arranging ambulance transportation for pt to [facility].  No further social work needs identified at this time.  CSW signing off.   Jacklynn Lewis, MSW, LCSWA  Clinical Social Work [phone]

## 2013-10-03 NOTE — Consult Note (Signed)
[facility] Liaison: [employer] available for Mrs. Nemes today. Family completed transfer paper work yesterday. Dr. Kern Reap to assume care per family request. Please fax discharge summary to [phone] and have RN call report to [phone]. CSW Suzanna aware. Thank you. [name] LCSW [phone]

## 2013-10-03 NOTE — Discharge Summary (Signed)
Physician Discharge Summary  [name] DGU:[id number] DOB: 11-18-1928 DOA: 09/28/2013  PCP: Ezequiel Kayser, MD  Admit date: 09/28/2013 Discharge date: 10/03/2013  Recommendations for Outpatient Follow-up:  1. The patient is being discharged to [facility] for end of life care.  Discharge Diagnoses:  Principal Problem:    Clostridium Difficile enteritis with abdominal pain and diarrhea Active Problems:    COPD (chronic obstructive pulmonary disease)    Tobacco abuse    Laryngeal cancer    Hyponatremia    Abdominal pain, acute, left lower quadrant    Protein-calorie malnutrition, severe    Dyspnea    Adult failure to thrive    Anxiety state, unspecified    Palliative care encounter    SBO (small bowel obstruction)    Atrial fibrillation with RVR    Hypokalemia    Discharge Condition: Terminal  Diet recommendation: NPO.  History of present illness:  [name] is an 79 year old female with a PMH of recently diagnosed laryngeal cancer, renal cancer status post nephrectomy, AAA, ongoing nicotine abuse who was admitted on 09/28/2013 with severe abdominal pain and diarrhea.  Hospital Course by problem:  Principal Problem:   Clostridium Difficile enteritis with abdominal pain and diarrhea / Hypokalemia -Patient initially presented tachycardiac, hr 115-120'S, BP 100/63, frail-appearing and was admitted to step down.  -Initially placed on ciprofloxacin and Flagyl.  -Cipro was discontinued after being positive for cdiff.  -Sx worsened on 09/30/13, so PO vancomycin added.  -PO Vancomycin converted to per rectum when she developed SBO (see below), but had refused.  -Pt now comfort care - see below.  -All antibiotics now discontinued. -Continue pain control with Roxanol/IV morphine as needed. -Continue rectal tube. -Hypokalemia secondary to diarrhea noted however this was not treated secondary to patient's wishes for comfort care only. Active Problems:   COPD  (chronic obstructive pulmonary disease) / Dyspnea / Tobacco abuse -Continue nebulized bronchodilator therapy and oxygen as needed.   Laryngeal cancer / Palliative care encounter -Seen by oncologist and palliative care team while in the hospital. Comfort care only. No further outpatient followup planned.   Hyponatremia -Initially treated with IV fluids. Improved to a sodium 132 before lab draws were discontinued.   Protein-calorie malnutrition, severe / Adult failure to thrive -Secondary to cancer related cachexia and small bowel obstruction.   Anxiety state, unspecified -Ativan PRN.   SBO (small bowel obstruction) -Slightly worse as of 10/01/13.  -Not improved w/ NG to low wall suction. -Comfort care now.   Atrial fibrillation with RVR -Now off Cardizem. Converted back to normal sinus rhythm 10/02/2013.  Procedures:  None.  Consultations:  Lorinda Creed, NP, Palliative Care  Dr. Artis Delay, Oncology  Discharge Exam: Filed Vitals:   10/03/13 0500  BP: 96/60  Pulse: 63  Temp: 98 F (36.7 C)  Resp: 20   Filed Vitals:   10/02/13 1438 10/02/13 1850 10/02/13 2111 10/03/13 0500  BP: 158/74 141/78 137/85 96/60  Pulse: 90 92 113 63  Temp: 98 F (36.7 C) 98.4 F (36.9 C) 97.9 F (36.6 C) 98 F (36.7 C)  TempSrc: Oral Oral Oral Oral  Resp: 24 23 24 20   Height:      Weight:      SpO2: 94% 96% 94% 94%    Gen:  NAD Cardiovascular:  RRR, No M/R/G Respiratory: Lungs CTAB Gastrointestinal: Abdomen softly distended, with a few bowel sounds. Extremities: No C/E/C   Discharge Instructions      Discharge Orders   Future Appointments Provider Department  Dept Phone   11/01/2013 1:30 PM Peter M [place], MD [facility] [place] Office [phone]   06/04/2014 9:00 AM [facility] [place] [facility] [address] [phone]   Eat a light meal the night before the exam but please avoid gaseous foods.   Nothing to eat or drink for at least 8 hours prior to the exam.  No gum chewing or smoking the morning of the exam. Please take your morning medications with small sips of water, especially blood pressure medication. If you have several vascular lab exams and will see physician, please bring a snack with you.   06/04/2014 9:30 AM Pryor Ochoa, MD [facility] [phone]   Future Orders Complete By Expires   Call MD for:  persistant nausea and vomiting  As directed    Call MD for:  severe uncontrolled pain  As directed    Diet general  As directed    Increase activity slowly  As directed        Medication List    STOP taking these medications       ADVAIR HFA IN     amLODipine 5 MG tablet  Commonly known as:  NORVASC     amoxicillin 875 MG tablet  Commonly known as:  AMOXIL     aspirin 81 MG tablet     beta carotene w/minerals tablet     dexamethasone 4 MG tablet  Commonly known as:  DECADRON     ergocalciferol 50000 UNITS capsule  Commonly known as:  VITAMIN D2     Hydrocodone-Acetaminophen 10-300 MG/15ML Soln  Commonly known as:  LORTAB     metoprolol tartrate 25 MG tablet  Commonly known as:  LOPRESSOR     RECLAST IV     rosuvastatin 20 MG tablet  Commonly known as:  CRESTOR      TAKE these medications       brimonidine 0.15 % ophthalmic solution  Commonly known as:  ALPHAGAN  Place 1 drop into both eyes at bedtime.     brinzolamide 1 % ophthalmic suspension  Commonly known as:  AZOPT  Place 1 drop into both eyes at bedtime.     ciprofloxacin-dexamethasone otic suspension  Commonly known as:  CIPRODEX  Place 2 drops into the right ear 2 (two) times daily.     levalbuterol 0.63 MG/3ML nebulizer solution  Commonly known as:  XOPENEX  Take 3 mLs (0.63 mg total) by nebulization every 6 (six) hours as needed for wheezing or shortness of breath.     lidocaine-prilocaine cream  Commonly known as:  EMLA  Apply topically as needed.     LORazepam 2 MG/ML injection  Commonly known as:   ATIVAN  Inject 0.5 mLs (1 mg total) into the vein every 4 (four) hours as needed for anxiety.     LUMIGAN 0.03 % ophthalmic solution  Generic drug:  bimatoprost  Place 1 drop into both eyes at bedtime.     morphine 2 MG/ML injection  Inject 0.5 mLs (1 mg total) into the vein every hour as needed (dyspnea).     morphine CONCENTRATE 10 mg / 0.5 ml concentrated solution  Take 0.25 mLs (5 mg total) by mouth every 2 (two) hours as needed.     ondansetron 8 MG tablet  Commonly known as:  ZOFRAN  Take 1 tablet (8 mg total) by mouth every 8 (eight) hours as needed for nausea.     polyvinyl alcohol 1.4 % ophthalmic  solution  Commonly known as:  LIQUIFILM TEARS  Place 1 drop into both eyes 4 (four) times daily as needed.     prochlorperazine 10 MG tablet  Commonly known as:  COMPAZINE  Take 1 tablet (10 mg total) by mouth every 6 (six) hours as needed (Nausea).     scopolamine 1.5 MG  Commonly known as:  TRANSDERM-SCOP  Place 1 patch (1.5 mg total) onto the skin every 3 (three) days.          The results of significant diagnostics from this hospitalization (including imaging, microbiology, ancillary and laboratory) are listed below for reference.    Significant Diagnostic Studies: Dg Chest 2 View  09/28/2013   CLINICAL DATA:  Cough.  EXAM: CHEST  2 VIEW  COMPARISON:  September 05, 2013.  FINDINGS: Hyperexpansion of the lungs is noted consistent with chronic obstructive pulmonary disease. No pleural effusion or pneumothorax is noted. Right internal jugular Port-A-Cath is noted with tip in expected position of the SVC. Bilateral breast implants are noted. No acute pulmonary disease is noted. Cardiomediastinal silhouette appears normal.  IMPRESSION: Chronic obstructive pulmonary disease.  No acute abnormality seen.   Electronically Signed   By: Roque Lias M.D.   On: 09/28/2013 08:34   Ct Abdomen Pelvis W Contrast  09/28/2013   CLINICAL DATA:  Constipation abdominal pain.  EXAM: CT  ABDOMEN AND PELVIS WITH CONTRAST  TECHNIQUE: Multidetector CT imaging of the abdomen and pelvis was performed using the Newman protocol following bolus administration of intravenous contrast.  CONTRAST:  50mL OMNIPAQUE IOHEXOL 300 MG/ML SOLN, 60mL OMNIPAQUE IOHEXOL 300 MG/ML SOLN  COMPARISON:  PET CT scan 09/11/2013 and CT abdomen 07/18/2013.  FINDINGS: The lung bases are clear. Densely calcified breast implants are noted. There is no pleural or pericardial effusion.  There is marked thickening of the walls of the distal ileum best seen in loops in the pelvis. Extensive infiltration of mesenteric fat within the pelvis is identified There is small amount of scattered abdominal ascites. No focal fluid collection is present. No pneumatosis, portal venous gas or free intraperitoneal air is identified. More proximal loops of small bowel show air-fluid levels and mild dilation at 3.4 cm. The stomach is unremarkable. The colon is largely decompressed with extensive diverticular disease identified but no evidence of diverticulitis.  As seen on prior examinations, there is a juxta-renal abdominal aortic aneurysm measuring 4.1 x 4.2 cm, unchanged. No evidence of hemorrhage is identified. The gallbladder, liver, and spleen are unremarkable. Enhancing foci within the liver and pancreas seen on the 07/18/2013 study are not visualized on this examination as no arterial phase imaging was performed. The left kidney has been removed. Compensatory hypertrophy of the right kidney is identified. Scarring in the right kidney consistent with prior tumor ablation is noted. Right renal cysts are present. Uterus and adnexa are unremarkable. The urinary bladder is distended but otherwise unremarkable. Bones demonstrate severe convex left scoliosis within osteoarthritis about the right hip. Old left hip fracture with fixation hardware in place noted. Remote left pubic rami fractures also seen.  IMPRESSION: Abnormal appearance of loops of  distal ileum as described above is consistent with enteritis. Although no specific features to suggest ischemia are identified, ischemia cannot be excluded. The CT appearance is most in keeping with an infectious or inflammatory enteritis.  No change in a 4.1 cm juxtarenal abdominal aortic aneurysm.  Solitary right kidney with postoperative change of ablation of the renal tumor.  Critical Value/emergent results were called by telephone  at the time of interpretation on 09/28/2013 at 11:59 AM to Dr.IVA KNAPP , who verbally acknowledged these results.   Electronically Signed   By: Drusilla Kanner M.D.   On: 09/28/2013 12:37   Dg Abd 2 Views  09/28/2013   CLINICAL DATA:  Lower abdominal pain. Diarrhea.  EXAM: ABDOMEN - 2 VIEW  COMPARISON:  CT abdomen pelvis 07/20/2013.  FINDINGS: Few prominent loops of small bowel are demonstrated within the left lower quadrant. Otherwise relative paucity of small bowel gas. No evidence for pneumatosis or free intraperitoneal air. Postsurgical clips within the left upper quadrant. Extensive aortic vascular calcifications with aneurysmal dilation of the mid abdominal aorta. Moderate lower thoracic and lumbar spine degenerative change. Surgical hardware within the left femur. Bilateral hip joint degenerative change.  IMPRESSION: Few nonspecific mildly prominent loops of small bowel within the left lower quadrant without definite evidence for ileus or obstruction.   Electronically Signed   By: Annia Belt M.D.   On: 09/28/2013 08:38   Dg Abd Portable 1v  10/01/2013   CLINICAL DATA:  Small bowel obstruction  EXAM: PORTABLE ABDOMEN - 1 VIEW  COMPARISON:  September 30, 2013 the radiograph as well as CT abdomen and pelvis September 28, 2013  FINDINGS: Generalized bowel dilatation persists with overall slight increase in small bowel dilatation in the left abdomen. No free air. There is extensive calcification in the aorta with dilatation proximally, grossly stable. There are surgical clips  in upper abdomen. There is postoperative change in the left hip region. There is narrowing of both hip joints.  IMPRESSION: Persistent generalized small bowel dilatation with slight increase in dilatation in the left abdomen. No free air. Differential considerations include persistent obstruction and ileus. Persistent bowel obstruction is felt to be more likely given the recent imaging examinations.   Electronically Signed   By: Bretta Bang M.D.   On: 10/01/2013 07:29   Dg Abd Portable 1v  09/30/2013   CLINICAL DATA:  Abdominal pain  EXAM: PORTABLE ABDOMEN - 1 VIEW  COMPARISON:  09/28/2013  FINDINGS: Disproportionally dilated small bowel loops extend across the abdomen. Mild distention of: Marland Kitchen No obvious free intraperitoneal gas. Postoperative, posttraumatic, and degenerative changes of the bony structures are stable.  IMPRESSION: Stable partial small bowel obstruction pattern.   Electronically Signed   By: Maryclare Bean M.D.   On: 09/30/2013 09:14    Labs:  Basic Metabolic Panel:  Recent Labs Lab 09/26/13 1239  09/28/13 0746 09/29/13 0355 09/30/13 0823 10/01/13 0950  NA 132*  --  127* 125* 128* 132*  K 5.4*  < > 4.0 3.9 3.3* 2.8*  CL  --   --  97 98 100 103  CO2 27  --  20 19 17* 18*  GLUCOSE 118  --  124* 144* 112* 82  BUN 31.4*  --  27* 17 13 12   CREATININE 0.8  --  0.51 0.50 0.40* 0.44*  CALCIUM 8.8  --  8.1* 6.8* 7.0* 7.0*  MG  --   --   --   --  1.9  --   < > = values in this interval not displayed. GFR Estimated Creatinine Clearance: 30.6 ml/min (by C-G formula based on Cr of 0.44). Liver Function Tests:  Recent Labs Lab 09/26/13 1239 09/28/13 0746  AST 24 21  ALT 32 30  ALKPHOS 73 80  BILITOT 0.27 0.4  PROT 7.8 6.8  ALBUMIN 2.5* 2.5*   CBC:  Recent Labs Lab 09/26/13 1239 09/28/13 0746 09/29/13  1610 09/30/13 0823 10/01/13 0950  WBC 15.5* 3.5* 6.0 8.8 10.2  NEUTROABS 14.1* 3.1  --  8.0*  --   HGB 13.2 14.7 11.7* 12.5 11.0*  HCT 39.6 42.6 34.4* 36.1 32.0*   MCV 90.8 88.6 88.4 87.4 87.2  PLT 505* 365 246 321 305   Microbiology Recent Results (from the past 240 hour(s))  MRSA PCR SCREENING     Status: None   Collection Time    09/28/13  3:42 PM      Result Value Range Status   MRSA by PCR NEGATIVE  NEGATIVE Final   Comment:            The GeneXpert MRSA Assay (FDA     approved for NASAL specimens     only), is one component of a     comprehensive MRSA colonization     surveillance program. It is not     intended to diagnose MRSA     infection nor to guide or     monitor treatment for     MRSA infections.  URINE CULTURE     Status: None   Collection Time    09/28/13  3:51 PM      Result Value Range Status   Specimen Description URINE, RANDOM   Final   Special Requests NONE   Final   Culture  Setup Time     Final   Value: 09/28/2013 21:53     Performed at [facility] Count     Final   Value: 75,000 COLONIES/ML     Performed at [facility]   Culture     Final   Value: Multiple bacterial morphotypes present, none predominant. Suggest appropriate recollection if clinically indicated.     Performed at [facility]   Report Status 09/29/2013 FINAL   Final  CLOSTRIDIUM DIFFICILE BY PCR     Status: Abnormal   Collection Time    09/29/13  7:03 AM      Result Value Range Status   C difficile by pcr POSITIVE (*) NEGATIVE Final   Comment: CRITICAL RESULT CALLED TO, READ BACK BY AND VERIFIED WITH:     BISHOP J.,RN 09/29/13 1148 BY JONESJ     Performed at [facility]  STOOL CULTURE     Status: None   Collection Time    09/29/13  7:04 AM      Result Value Range Status   Specimen Description STOOL   Final   Special Requests NONE   Final   Culture     Final   Value: NO SUSPICIOUS COLONIES, CONTINUING TO HOLD     Performed at [facility]   Report Status PENDING   Incomplete    Time coordinating discharge:  35 minutes.  Signed:  Maxine Huynh  Pager [phone] [facility] 10/03/2013, 8:59 AM

## 2013-10-08 ENCOUNTER — Telehealth: Payer: Self-pay | Admitting: *Deleted

## 2013-10-08 NOTE — Telephone Encounter (Signed)
Called pt to see how she is doing s/p last week's admission.  LVM.  Will continue to navigate as L2 (treatments established) patient.  Young Berry, RN, BSN, [facility] Navigator [phone]

## 2013-10-09 ENCOUNTER — Telehealth: Payer: Self-pay | Admitting: *Deleted

## 2013-10-10 NOTE — Telephone Encounter (Signed)
Received VM from patient's husband indicating that his wife died 2023-05-11 while under [facility] care at [facility].  Young Berry, RN, BSN, [facility] Navigator [phone]

## 2013-10-10 NOTE — Telephone Encounter (Signed)
Unintended note

## 2013-10-13 DEATH — deceased

## 2013-11-01 ENCOUNTER — Ambulatory Visit: Payer: Medicare Other | Admitting: Cardiology

## 2013-11-22 ENCOUNTER — Telehealth: Payer: Self-pay | Admitting: Vascular Surgery

## 2013-11-22 NOTE — Telephone Encounter (Signed)
Confirmed date of death  2013-10-23 per obituary.

## 2013-11-22 NOTE — Telephone Encounter (Signed)
Hal Shermika's husband called to let us know that she passed away on 10/12/13. She is scheduled for an appointment on 06/18/14 that we need to cancel.  Thanks Revonda Standard

## 2013-12-18 NOTE — Telephone Encounter (Signed)
Patient noted as deceased in [facility]. [name] ° °

## 2014-01-11 ENCOUNTER — Other Ambulatory Visit: Payer: Self-pay | Admitting: Hematology and Oncology

## 2014-06-04 ENCOUNTER — Other Ambulatory Visit (HOSPITAL_COMMUNITY): Payer: Medicare Other

## 2014-06-04 ENCOUNTER — Ambulatory Visit: Payer: Medicare Other | Admitting: Vascular Surgery

## 2014-06-18 ENCOUNTER — Ambulatory Visit: Payer: Medicare Other | Admitting: Vascular Surgery

## 2014-06-18 ENCOUNTER — Other Ambulatory Visit (HOSPITAL_COMMUNITY): Payer: Medicare Other

## 2014-08-14 IMAGING — PT NM PET TUM IMG INITIAL (PI) SKULL BASE T - THIGH
6 series · 25 of 25 positions shown · non-contrast
Comparison: None.

CLINICAL DATA: Initial treatment strategy for laryngeal carcinoma.
Previous treatment for renal cell carcinoma.

EXAM:
NUCLEAR MEDICINE PET SKULL BASE TO THIGH
FASTING BLOOD GLUCOSE:  Value: 124 mg/dl
TECHNIQUE: 13.7 mCi F-18 FDG was injected intravenously. CT data was obtained
and used for attenuation correction and anatomic localization only.
(This was not acquired as a diagnostic CT examination.) Additional
exam technical data entered on technologist worksheet.

[Series 1: pet ac · axial · 3.3mm · 4.69mm/px · z∈[-775,-49]mm · 5 of 223 slices shown]
[im 1/223]
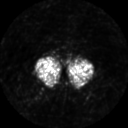
[im 56/223]
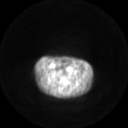
[im 112/223]
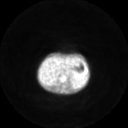
[im 167/223]
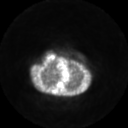
[im 223/223]
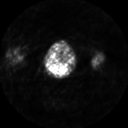

[Series 2: ct images · axial · 3.8mm · 0.98mm/px · z∈[-775,-49]mm · 6 of 223 slices shown]
[im 1/223]
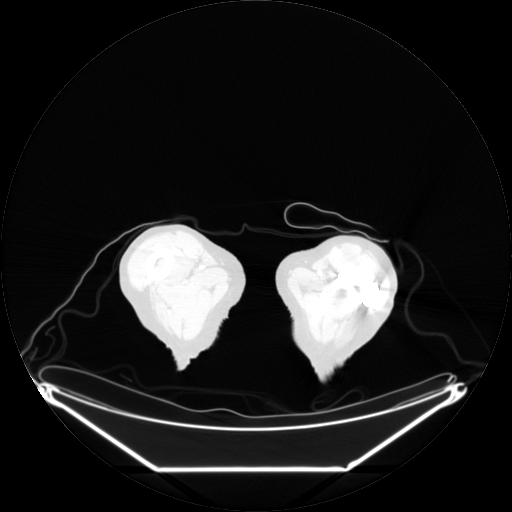
[im 45/223]
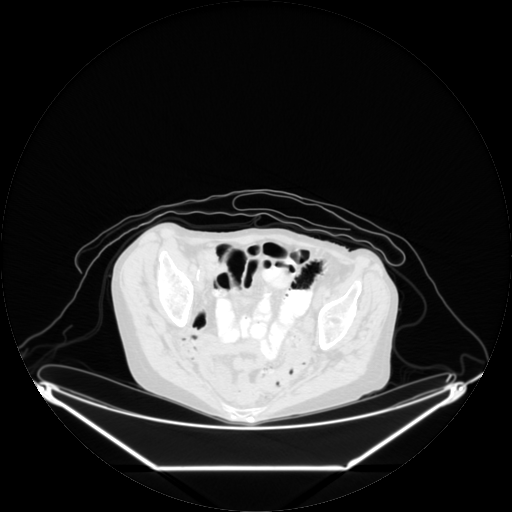
[im 89/223]
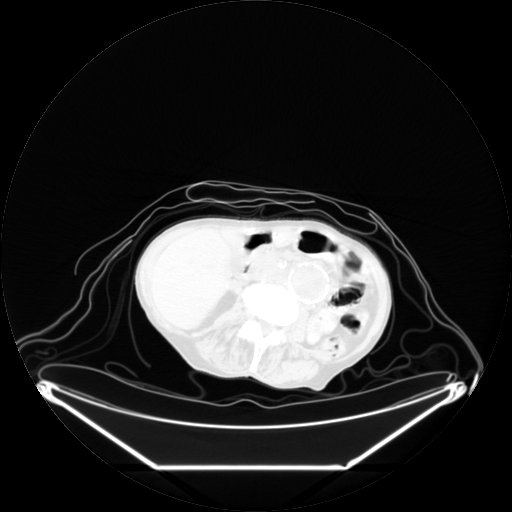
[im 134/223]
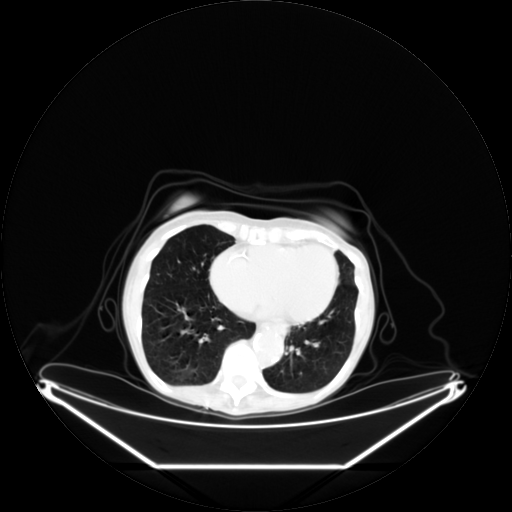
[im 178/223]
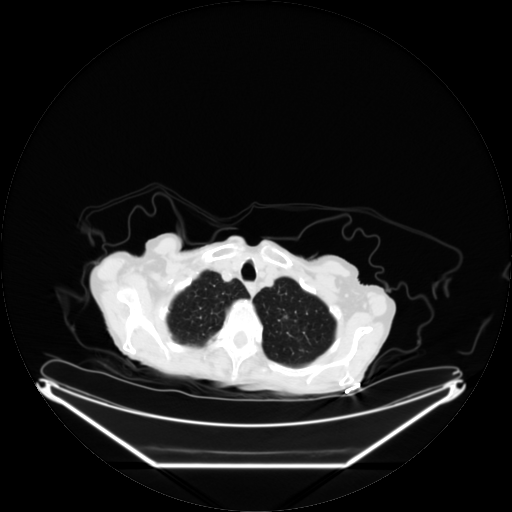
[im 223/223]
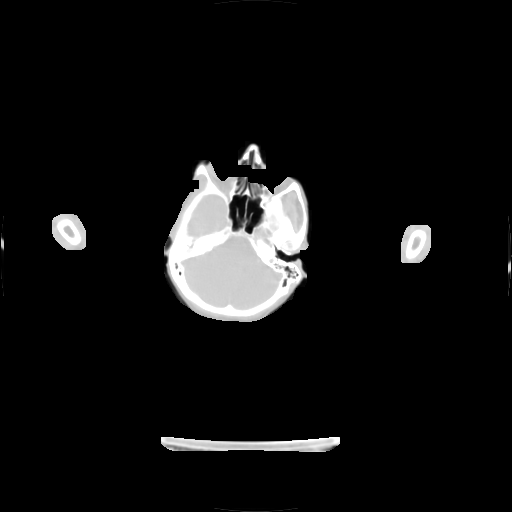

[Series 2: pet nac · axial · 3.3mm · 4.69mm/px · z∈[-775,-49]mm · 6 of 223 slices shown]
[im 1/223]
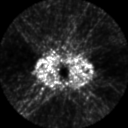
[im 45/223]
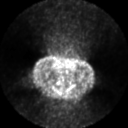
[im 89/223]
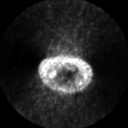
[im 134/223]
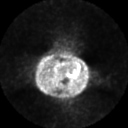
[im 178/223]
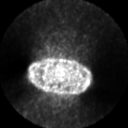
[im 223/223]
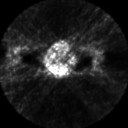

[Series 123: mip · coronal · 3.3mm · 4.69mm/px · 1 of 30 slices shown]
[im 1/30]
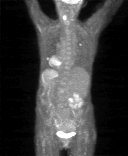

[Series 151: reformatted · axial · 3.3mm · 3.91mm/px · z∈[-775,-49]mm · 6 of 221 slices shown (1 of 2)]
[im 1/221]
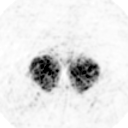
[im 45/221]
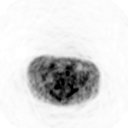
[im 89/221]
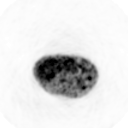
[im 133/221]
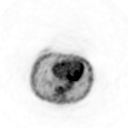
[im 177/221]
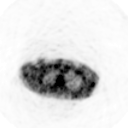
[im 221/221]
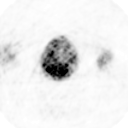

[Series 153: reformatted · coronal · 4.7mm · 5.83mm/px · 1 of 56 slices shown (2 of 2)]
[im 1/56]
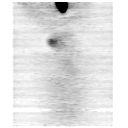

[25 of 25 positions shown; findings below may reference images not displayed]

FINDINGS: NECK

A left supraglottic mass is seen with diffuse hypermetabolic
activity gallbladder and maximum SUV measuring 17.1.

No left-sided hypermetabolic lymph nodes are seen. A small less than
1 cm hypermetabolic lymph node is seen on the right, in the high
upper internal jugular chain (level IIa) on image 27-28, which has a
maximum SUV of 4.6.

CHEST

No hypermetabolic mediastinal or hilar nodes. A 1.5 cm spiculated
nodule is seen in the posterior right upper lobe which has
hypermetabolic activity. The maximum SUV measures 4.5, and in the
spiculated morphology by CT suggests a primary bronchogenic
carcinoma. Smaller spiculated nodule measuring approximately 8 mm in
the right lung apex shows no hypermetabolic activity, but is below
size threshold for reliable detection of malignancy.

ABDOMEN/PELVIS

No abnormal hypermetabolic activity within the liver, pancreas,
adrenal glands, or spleen. No hypermetabolic lymph nodes in the
abdomen or pelvis. 4 cm abdominal aortic aneurysm again noted,
without significant change compared to prior CT on 07/18/2013.

SKELETON

No focal hypermetabolic activity to suggest skeletal metastasis.
IMPRESSION: Hypermetabolic left supraglottic laryngeal mass, consistent with
laryngeal carcinoma.

Tiny less than 1 cm hypermetabolic lymph node in the right neck high
upper internal jugular chain ; metastatic disease cannot be
excluded.

1.5 cm spiculated hypermetabolic nodule in the posterior right upper
lobe, with more morphology favoring a primary bronchogenic carcinoma
over pulmonary metastasis. A 2nd 8 mm spiculated nodule in the right
lung apex shows absence of hypermetabolic activity, but is below PET
size threshold for reliable detection of malignancy.

No evidence of malignancy within the abdomen or pelvis.

## 2014-09-03 IMAGING — CR DG ABD PORTABLE 1V
1 series · 1 of 1 positions shown · non-contrast
Comparison: September 30, 2013 the radiograph as well as CT abdomen
and pelvis September 28, 2013

CLINICAL DATA: Small bowel obstruction

EXAM:
PORTABLE ABDOMEN - 1 VIEW

[ap (kub)]
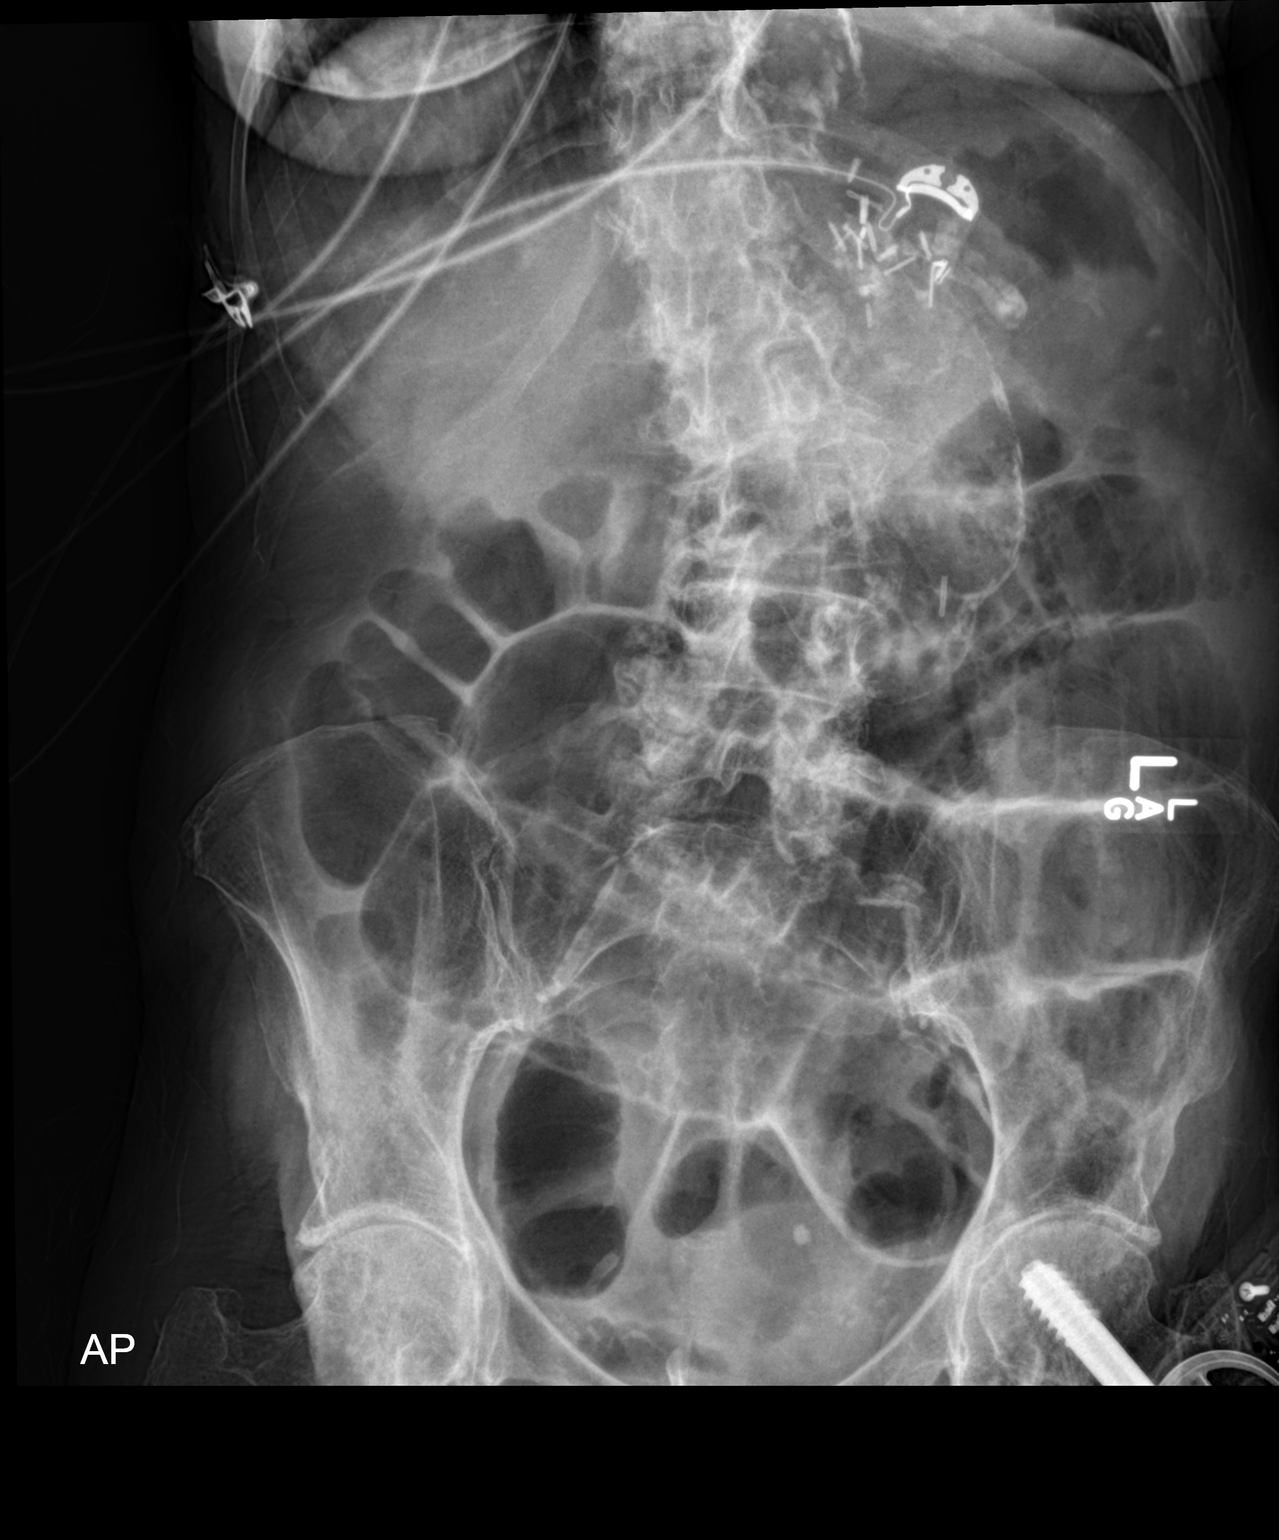

[1 of 1 positions shown; findings below may reference images not displayed]

FINDINGS: Generalized bowel dilatation persists with overall slight increase
in small bowel dilatation in the left abdomen. No free air. There is
extensive calcification in the aorta with dilatation proximally,
grossly stable. There are surgical clips in upper abdomen. There is
postoperative change in the left hip region. There is narrowing of
both hip joints.
IMPRESSION: Persistent generalized small bowel dilatation with slight increase
in dilatation in the left abdomen. No free air. Differential
considerations include persistent obstruction and ileus. Persistent
bowel obstruction is felt to be more likely given the recent imaging
examinations.
